# Patient Record
Sex: Male | Born: 1955
Health system: Southern US, Community
[De-identification: ages and names within clinical notes are randomized; demographics above are authoritative.]

## PROBLEM LIST (undated history)

## (undated) DIAGNOSIS — E7800 Pure hypercholesterolemia, unspecified: Secondary | ICD-10-CM

## (undated) DIAGNOSIS — F1910 Other psychoactive substance abuse, uncomplicated: Secondary | ICD-10-CM

## (undated) DIAGNOSIS — Z5189 Encounter for other specified aftercare: Secondary | ICD-10-CM

## (undated) DIAGNOSIS — I10 Essential (primary) hypertension: Secondary | ICD-10-CM

## (undated) HISTORY — DX: Encounter for other specified aftercare: Z51.89

## (undated) HISTORY — DX: Other psychoactive substance abuse, uncomplicated: F19.10

---
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
## 1975-12-03 HISTORY — PX: SHOULDER SURGERY: SHX246

## 1988-12-02 DIAGNOSIS — Z5189 Encounter for other specified aftercare: Secondary | ICD-10-CM

## 1988-12-02 HISTORY — DX: Encounter for other specified aftercare: Z51.89

## 1989-12-02 HISTORY — PX: OTHER SURGICAL HISTORY: SHX169

## 1990-12-02 HISTORY — PX: OTHER SURGICAL HISTORY: SHX169

## 1996-12-02 HISTORY — PX: OTHER SURGICAL HISTORY: SHX169

## 1999-11-05 ENCOUNTER — Encounter: Admission: RE | Admit: 1999-11-05 | Discharge: 1999-11-05 | Payer: Self-pay | Admitting: Family Medicine

## 1999-11-05 ENCOUNTER — Encounter: Payer: Self-pay | Admitting: Family Medicine

## 2000-04-25 ENCOUNTER — Ambulatory Visit (HOSPITAL_COMMUNITY): Admission: RE | Admit: 2000-04-25 | Discharge: 2000-04-25 | Payer: Self-pay | Admitting: *Deleted

## 2006-08-24 ENCOUNTER — Emergency Department (HOSPITAL_COMMUNITY): Admission: EM | Admit: 2006-08-24 | Discharge: 2006-08-24 | Payer: Self-pay | Admitting: Emergency Medicine

## 2006-08-30 ENCOUNTER — Emergency Department (HOSPITAL_COMMUNITY): Admission: EM | Admit: 2006-08-30 | Discharge: 2006-08-30 | Payer: Self-pay | Admitting: Family Medicine

## 2012-10-06 ENCOUNTER — Ambulatory Visit (INDEPENDENT_AMBULATORY_CARE_PROVIDER_SITE_OTHER): Payer: PRIVATE HEALTH INSURANCE | Admitting: Emergency Medicine

## 2012-10-06 VITALS — BP 128/64 | HR 73 | Temp 98.5°F | Resp 16 | Ht 69.0 in | Wt 226.0 lb

## 2012-10-06 DIAGNOSIS — N529 Male erectile dysfunction, unspecified: Secondary | ICD-10-CM

## 2012-10-06 DIAGNOSIS — Z0289 Encounter for other administrative examinations: Secondary | ICD-10-CM

## 2012-10-06 DIAGNOSIS — Z23 Encounter for immunization: Secondary | ICD-10-CM

## 2012-10-06 MED ORDER — VARDENAFIL HCL 20 MG PO TABS: 20.0000 mg | ORAL_TABLET | Freq: Every day | ORAL | Status: DC | PRN

## 2012-10-06 NOTE — Progress Notes (Signed)
UA: Sp Gr: 1.010 Protein: 0 Blood: 0 Sugar: 0

## 2012-10-06 NOTE — Progress Notes (Signed)
  Subjective:    Patient ID: [name] [name], male    DOB: 03/25/1956, 56 y.o.   MRN: [id number]  HPI  Difficulty maintaining an adequate erection for completion of intercourse.  Denies other complaints.  Review of Systems  As per HPI, otherwise negative.      Objective:   Physical Exam  GEN: WDWN, NAD, Non-toxic, A & O x 3 HEENT: Atraumatic, Normocephalic. Neck supple. No masses, No LAD. Ears and Nose: No external deformity. CV: RRR, No M/G/R. No JVD. No thrill. No extra heart sounds. PULM: CTA B, no wheezes, crackles, rhonchi. No retractions. No resp. distress. No accessory muscle use. ABD: S, NT, ND, +BS. No rebound. No HSM. EXTR: No c/c/e NEURO Normal gait.  PSYCH: Normally interactive. Conversant. Not depressed or anxious appearing.  Calm demeanor.         Assessment & Plan:   Erectile dysfunction levitra Needs labs but not fasting Follow up for labs.

## 2012-10-08 NOTE — Progress Notes (Signed)
Reviewed and agree.

## 2014-02-05 ENCOUNTER — Emergency Department (INDEPENDENT_AMBULATORY_CARE_PROVIDER_SITE_OTHER)
Admission: EM | Admit: 2014-02-05 | Discharge: 2014-02-05 | Disposition: A | Discharge status: Home / Self Care | Payer: PRIVATE HEALTH INSURANCE | Source: Home / Self Care | Attending: Family Medicine | Admitting: Family Medicine

## 2014-02-05 ENCOUNTER — Encounter (HOSPITAL_COMMUNITY): Payer: Self-pay | Admitting: Emergency Medicine

## 2014-02-05 DIAGNOSIS — B353 Tinea pedis: Secondary | ICD-10-CM

## 2014-02-05 LAB — POCT I-STAT, CHEM 8
BUN: 15 mg/dL (ref 6–23)
Calcium, Ion: 1.15 mmol/L (ref 1.12–1.23)
Chloride: 108 mEq/L (ref 96–112)
Creatinine, Ser: 1.3 mg/dL (ref 0.50–1.35)
Glucose, Bld: 105 mg/dL — ABNORMAL HIGH (ref 70–99)
HCT: 49 % (ref 39.0–52.0)
HEMOGLOBIN: 16.7 g/dL (ref 13.0–17.0)
Potassium: 4 mEq/L (ref 3.7–5.3)
SODIUM: 145 meq/L (ref 137–147)
TCO2: 23 mmol/L (ref 0–100)

## 2014-02-05 MED ORDER — TERBINAFINE HCL 250 MG PO TABS: 250.0000 mg | ORAL_TABLET | Freq: Every day | ORAL | Status: DC

## 2014-02-05 NOTE — ED Provider Notes (Signed)
CSN: [id number]     Arrival date & time 02/05/14  1100 History   First MD Initiated Contact with Patient 02/05/14 1205     Chief Complaint  Patient presents with  . Foot Burn   (Consider location/radiation/quality/duration/timing/severity/associated sxs/prior Treatment) Patient is a 58 y.o. male presenting with lower extremity pain. The history is provided by the patient.  Foot Pain This is a new problem. The current episode started more than 1 week ago (sx for 2 mo). The problem has been gradually worsening (worse today after soaking in clorox). Exacerbated by: clorox soak, also feels feet worse b/o steel toe boots for work.    History reviewed. No pertinent past medical history. History reviewed. No pertinent past surgical history. History reviewed. No pertinent family history. History  Substance Use Topics  . Smoking status: Never Smoker   . Smokeless tobacco: Not on file  . Alcohol Use: No    Review of Systems  Constitutional: Negative.   Musculoskeletal: Positive for joint swelling.  Skin: Positive for rash.    Allergies  Review of patient's allergies indicates no known allergies.  Home Medications   Current Outpatient Rx  Name  Route  Sig  Dispense  Refill  . terbinafine (LAMISIL) 250 MG tablet   Oral   Take 1 tablet (250 mg total) by mouth daily.   30 tablet   0   . vardenafil (LEVITRA) 20 MG tablet   Oral   Take 1 tablet (20 mg total) by mouth daily as needed for erectile dysfunction.   10 tablet   12    BP 154/86  Pulse 92  Temp(Src) 97.8 F (36.6 C) (Oral)  Resp 21  SpO2 99% Physical Exam  Nursing note and vitals reviewed. Constitutional: He is oriented to person, place, and time. He appears well-developed and well-nourished.  Musculoskeletal: He exhibits tenderness.       Right foot: He exhibits tenderness and swelling.       Feet:  Neurological: He is alert and oriented to person, place, and time.  Skin: Skin is warm and dry. Rash noted.     ED Course  Procedures (including critical care time) Labs Review Labs Reviewed  POCT I-STAT, CHEM 8 - Abnormal; Notable for the following:    Glucose, Bld 105 (*)    All other components within normal limits   Imaging Review No results found. i-stat :wnl.  MDM   1. Tinea pedis of both feet       Billy Fischer, MD 02/05/14 1256

## 2014-02-05 NOTE — ED Notes (Signed)
Pt         Reports   He    Soaked      His   feets        In  clorox          This  Am       Has         Had  atheletes  Foot  In  Past            Has  Cracking   And  Swelling       In past

## 2014-03-18 ENCOUNTER — Ambulatory Visit (INDEPENDENT_AMBULATORY_CARE_PROVIDER_SITE_OTHER): Payer: PRIVATE HEALTH INSURANCE | Admitting: Family Medicine

## 2014-03-18 VITALS — BP 138/92 | HR 82 | Temp 98.0°F | Resp 16 | Ht 70.0 in | Wt 255.0 lb

## 2014-03-18 DIAGNOSIS — N529 Male erectile dysfunction, unspecified: Secondary | ICD-10-CM

## 2014-03-18 DIAGNOSIS — E669 Obesity, unspecified: Secondary | ICD-10-CM

## 2014-03-18 DIAGNOSIS — Z0000 Encounter for general adult medical examination without abnormal findings: Secondary | ICD-10-CM

## 2014-03-18 DIAGNOSIS — Z13220 Encounter for screening for lipoid disorders: Secondary | ICD-10-CM

## 2014-03-18 DIAGNOSIS — F1010 Alcohol abuse, uncomplicated: Secondary | ICD-10-CM

## 2014-03-18 DIAGNOSIS — Z125 Encounter for screening for malignant neoplasm of prostate: Secondary | ICD-10-CM

## 2014-03-18 DIAGNOSIS — Z23 Encounter for immunization: Secondary | ICD-10-CM

## 2014-03-18 LAB — COMPREHENSIVE METABOLIC PANEL
ALBUMIN: 4.3 g/dL (ref 3.5–5.2)
ALT: 38 U/L (ref 0–53)
AST: 37 U/L (ref 0–37)
Alkaline Phosphatase: 60 U/L (ref 39–117)
BUN: 19 mg/dL (ref 6–23)
CO2: 22 meq/L (ref 19–32)
Calcium: 9.1 mg/dL (ref 8.4–10.5)
Chloride: 105 mEq/L (ref 96–112)
Creat: 1.08 mg/dL (ref 0.50–1.35)
GLUCOSE: 98 mg/dL (ref 70–99)
POTASSIUM: 4.1 meq/L (ref 3.5–5.3)
SODIUM: 138 meq/L (ref 135–145)
Total Bilirubin: 0.5 mg/dL (ref 0.2–1.2)
Total Protein: 6.6 g/dL (ref 6.0–8.3)

## 2014-03-18 LAB — CBC WITH DIFFERENTIAL/PLATELET
Basophils Absolute: 0 10*3/uL (ref 0.0–0.1)
Basophils Relative: 0 % (ref 0–1)
EOS ABS: 0.1 10*3/uL (ref 0.0–0.7)
Eosinophils Relative: 2 % (ref 0–5)
HCT: 42.8 % (ref 39.0–52.0)
HEMOGLOBIN: 14.5 g/dL (ref 13.0–17.0)
LYMPHS ABS: 1.3 10*3/uL (ref 0.7–4.0)
Lymphocytes Relative: 24 % (ref 12–46)
MCH: 30.5 pg (ref 26.0–34.0)
MCHC: 33.9 g/dL (ref 30.0–36.0)
MCV: 90.1 fL (ref 78.0–100.0)
MONO ABS: 0.6 10*3/uL (ref 0.1–1.0)
MONOS PCT: 10 % (ref 3–12)
NEUTROS ABS: 3.6 10*3/uL (ref 1.7–7.7)
NEUTROS PCT: 64 % (ref 43–77)
Platelets: 228 10*3/uL (ref 150–400)
RBC: 4.75 MIL/uL (ref 4.22–5.81)
RDW: 14.2 % (ref 11.5–15.5)
WBC: 5.6 10*3/uL (ref 4.0–10.5)

## 2014-03-18 LAB — LIPID PANEL
CHOLESTEROL: 213 mg/dL — AB (ref 0–200)
HDL: 55 mg/dL (ref >39)
LDL Cholesterol: 139 mg/dL — ABNORMAL HIGH (ref 0–99)
Total CHOL/HDL Ratio: 3.9 Ratio
Triglycerides: 93 mg/dL (ref <150)
VLDL: 19 mg/dL (ref 0–40)

## 2014-03-18 LAB — HEPATITIS C ANTIBODY: HCV Ab: NEGATIVE

## 2014-03-18 MED ORDER — SILDENAFIL CITRATE 100 MG PO TABS: ORAL_TABLET | ORAL | Status: DC
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
## 2014-03-18 NOTE — Patient Instructions (Signed)
I will be in touch with your labs.   Try to limit your alcohol use to maximum 2 glasses of sangria a day. We will get you referred for your colonoscopy.   It would be a good idea to work on weight loss; cutting down on alcohol will also help here.  Try to eat a little bit less at each meal, and avoid drinking regular sodas and sweet tea.

## 2014-03-19 ENCOUNTER — Encounter: Payer: Self-pay | Admitting: Family Medicine

## 2014-03-19 LAB — PSA: PSA: 0.9 ng/mL (ref <=4.00)

## 2014-03-21 ENCOUNTER — Encounter: Payer: Self-pay | Admitting: Internal Medicine

## 2014-04-26 ENCOUNTER — Ambulatory Visit (AMBULATORY_SURGERY_CENTER): Payer: Self-pay | Admitting: *Deleted

## 2014-04-26 VITALS — Ht 70.0 in | Wt 256.2 lb

## 2014-04-26 DIAGNOSIS — Z1211 Encounter for screening for malignant neoplasm of colon: Secondary | ICD-10-CM

## 2014-04-26 MED ORDER — MOVIPREP 100 G PO SOLR: ORAL | Status: DC

## 2014-04-26 NOTE — Progress Notes (Signed)
No allergies to eggs or soy. No problems with anesthesia.  Pt not given Emmi instructions for colonoscopy; no computer access  No oxygen use  No diet drug use  

## 2014-05-10 ENCOUNTER — Encounter: Payer: Self-pay | Admitting: Internal Medicine

## 2014-05-10 ENCOUNTER — Ambulatory Visit (AMBULATORY_SURGERY_CENTER): Payer: PRIVATE HEALTH INSURANCE | Admitting: Internal Medicine

## 2014-05-10 VITALS — BP 136/93 | HR 69 | Temp 97.5°F | Resp 16 | Ht 70.0 in | Wt 256.0 lb

## 2014-05-10 DIAGNOSIS — Z1211 Encounter for screening for malignant neoplasm of colon: Secondary | ICD-10-CM

## 2014-05-10 DIAGNOSIS — D126 Benign neoplasm of colon, unspecified: Secondary | ICD-10-CM

## 2014-05-10 MED ORDER — SODIUM CHLORIDE 0.9 % IV SOLN: 500.0000 mL | INTRAVENOUS | Status: DC

## 2014-05-10 NOTE — Patient Instructions (Signed)

## 2014-05-10 NOTE — Op Note (Signed)
[place]  [address]. [place], [address]   COLONOSCOPY PROCEDURE REPORT  PATIENT: [name] [name] [name] [name]  MR#: [id number] BIRTHDATE: July 02, 1956 , 3  yrs. old GENDER: Male ENDOSCOPIST: Jerene Bears, MD REFERRED HY:QMVHQIO Copland, M.D. PROCEDURE DATE:  05/10/2014 PROCEDURE:   Colonoscopy with cold biopsy polypectomy and Colonoscopy with snare polypectomy First Screening Colonoscopy - Avg.  risk and is 50 yrs.  old or older Yes.  Prior Negative Screening - Now for repeat screening. N/A  History of Adenoma - Now for follow-up colonoscopy & has been > or = to 3 yrs.  N/A  Polyps Removed Today? Yes. ASA CLASS:   Class II INDICATIONS:average risk screening and first colonoscopy. MEDICATIONS: MAC sedation, administered by CRNA and propofol (Diprivan) 750mg  IV  DESCRIPTION OF PROCEDURE:   After the risks benefits and alternatives of the procedure were thoroughly explained, informed consent was obtained.  A digital rectal exam revealed no rectal mass.   The LB [id number]  endoscope was introduced through the anus and advanced to the cecum, which was identified by both the appendix and ileocecal valve. No adverse events experienced. The quality of the prep was good, using MoviPrep  The instrument was then slowly withdrawn as the colon was fully examined.   COLON FINDINGS: A sessile polyp measuring 10 mm in size was found at the appendiceal orifice.  A polypectomy was performed using snare cautery.  The resection was complete and the polyp tissue was completely retrieved.   Four sessile polyps ranging between 3-51mm in size were found at the cecum, in the ascending colon, and sigmoid colon (snare).  Polypectomy was performed with cold forceps and using cold snare.  All resections were complete and all polyp tissue was completely retrieved.  There was persistent oozing from the sigmoid cold snare polypectomy site, and 1 hemostatic clip was placed with success and  cessation of bleeding.  Mild diverticulosis was noted in the descending colon and sigmoid colon.  Retroflexed views revealed no abnormalities. The time to cecum=4 minutes 03 seconds.  Withdrawal time=32 minutes 52 seconds.  The scope was withdrawn and the procedure completed. COMPLICATIONS: There were no complications.  ENDOSCOPIC IMPRESSION: 1.   Sessile polyp measuring 10 mm in size was found at the appendiceal orifice; polypectomy was performed using snare cautery 2.   Four sessile polyps ranging between 3-83mm in size were found at the cecum, in the ascending colon, and sigmoid colon; Polypectomy was performed with cold forceps and using cold snare.  Hemostatic clip placed at polypectomy site in sigmoid colon 3.   Mild diverticulosis was noted in the descending colon and sigmoid colon      RECOMMENDATIONS: 1.  Hold aspirin, aspirin products, and anti-inflammatory medication for 2 weeks. 2.  Await pathology results 3.  High fiber diet 4.  If the polyps removed today are proven to be adenomatous (pre-cancerous) polyps, you will need a colonoscopy in 3 years. Otherwise you should continue to follow colorectal cancer screening guidelines for "routine risk" patients with a colonoscopy in 10 years.  You will receive a letter within 1-2 weeks with the results of your biopsy as well as final recommendations.  Please call my office if you have not received a letter after 3 weeks.   eSigned:  Jerene Bears, MD 05/10/2014 11:44 AM   cc: The Patient and Lamar Blinks, MD   PATIENT NAME:  [name] [name] MR#: [id number]

## 2014-05-10 NOTE — Progress Notes (Signed)
Report to PACU, RN, vss, BBS= Clear.  

## 2014-05-10 NOTE — Progress Notes (Signed)
Called to room to assist during endoscopic procedure.  Patient ID and intended procedure confirmed with present staff. Received instructions for my participation in the procedure from the performing physician.  

## 2014-05-11 ENCOUNTER — Telehealth: Payer: Self-pay | Admitting: *Deleted

## 2014-05-11 NOTE — Telephone Encounter (Signed)
  Follow up Call-  Call back number 05/10/2014  Post procedure Call Back phone  # [phone]  Permission to leave phone message Yes     Patient questions:  Do you have a fever, pain , or abdominal swelling? no Pain Score  0 *  Have you tolerated food without any problems? yes  Have you been able to return to your normal activities? yes  Do you have any questions about your discharge instructions: Diet   no Medications  no Follow up visit  no  Do you have questions or concerns about your Care? no  Actions: * If pain score is 4 or above: No action needed, pain <4.

## 2014-05-17 ENCOUNTER — Encounter: Payer: Self-pay | Admitting: Internal Medicine

## 2014-10-02 ENCOUNTER — Ambulatory Visit (INDEPENDENT_AMBULATORY_CARE_PROVIDER_SITE_OTHER): Payer: Self-pay | Admitting: Emergency Medicine

## 2014-10-02 VITALS — BP 134/84 | HR 83 | Temp 98.5°F | Resp 16 | Ht 69.75 in | Wt 253.0 lb

## 2014-10-02 DIAGNOSIS — Z021 Encounter for pre-employment examination: Secondary | ICD-10-CM

## 2014-10-02 MED ORDER — SILDENAFIL CITRATE 100 MG PO TABS
100.0000 mg | ORAL_TABLET | Freq: Every day | ORAL | Status: DC | PRN
Start: 2014-10-02 — End: 2018-01-30
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
## 2014-12-16 ENCOUNTER — Ambulatory Visit (INDEPENDENT_AMBULATORY_CARE_PROVIDER_SITE_OTHER): Payer: PRIVATE HEALTH INSURANCE | Admitting: Family Medicine

## 2014-12-16 ENCOUNTER — Ambulatory Visit (INDEPENDENT_AMBULATORY_CARE_PROVIDER_SITE_OTHER): Payer: PRIVATE HEALTH INSURANCE

## 2014-12-16 VITALS — BP 136/82 | HR 93 | Temp 98.5°F | Resp 18 | Ht 70.0 in | Wt 260.0 lb

## 2014-12-16 DIAGNOSIS — J209 Acute bronchitis, unspecified: Secondary | ICD-10-CM | POA: Diagnosis not present

## 2014-12-16 DIAGNOSIS — M25572 Pain in left ankle and joints of left foot: Secondary | ICD-10-CM

## 2014-12-16 DIAGNOSIS — R05 Cough: Secondary | ICD-10-CM | POA: Diagnosis not present

## 2014-12-16 DIAGNOSIS — R059 Cough, unspecified: Secondary | ICD-10-CM

## 2014-12-16 DIAGNOSIS — L309 Dermatitis, unspecified: Secondary | ICD-10-CM

## 2014-12-16 LAB — POCT CBC
GRANULOCYTE PERCENT: 73.3 % (ref 37–80)
HCT, POC: 43.8 % (ref 43.5–53.7)
Hemoglobin: 14 g/dL — AB (ref 14.1–18.1)
Lymph, poc: 1.8 (ref 0.6–3.4)
MCH, POC: 30.2 pg (ref 27–31.2)
MCHC: 32.1 g/dL (ref 31.8–35.4)
MCV: 94.1 fL (ref 80–97)
MID (cbc): 0.6 (ref 0–0.9)
MPV: 9.2 fL (ref 0–99.8)
POC Granulocyte: 6.5 (ref 2–6.9)
POC LYMPH PERCENT: 20 %L (ref 10–50)
POC MID %: 6.7 % (ref 0–12)
Platelet Count, POC: 258 10*3/uL (ref 142–424)
RBC: 4.65 M/uL — AB (ref 4.69–6.13)
RDW, POC: 15.1 %
WBC: 8.8 10*3/uL (ref 4.6–10.2)

## 2014-12-16 MED ORDER — AZITHROMYCIN 250 MG PO TABS: ORAL_TABLET | ORAL | Status: DC

## 2014-12-16 MED ORDER — TRIAMCINOLONE ACETONIDE 0.1 % EX CREA: 1.0000 "application " | TOPICAL_CREAM | Freq: Two times a day (BID) | CUTANEOUS | Status: DC

## 2014-12-16 MED ORDER — BENZONATATE 100 MG PO CAPS: 100.0000 mg | ORAL_CAPSULE | Freq: Three times a day (TID) | ORAL | Status: DC | PRN

## 2014-12-16 MED ORDER — HYDROCODONE-HOMATROPINE 5-1.5 MG/5ML PO SYRP: 5.0000 mL | ORAL_SOLUTION | ORAL | Status: DC | PRN

## 2014-12-16 NOTE — Progress Notes (Signed)
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 He says there is always a lot of dust and he knows his lungs been affected by that. He just started wearing a respirator. He has had a respiratory tract infection for the past week. He keeps coughing a lot. He's been short of breath. He does not smoke. He does not know if he has any COPD, but suspects damage from the years of working in the environment where he works. His wife was with him tonight. A few days ago he tripped and fell when his dog tangled up his feet. He sprained his left ankle which is been hurting since then. He walks with a limp. His right shoulder is also been a little bit sore. He has had old left shoulder surgery. Also complains of some rash on his knuckles  Objective: Pleasant gentleman these, no acute distress. His throat is clear. Neck supple without nodes. Chest clear to auscultation but a little limited air exchange. Heart regular without murmurs. Right arm has good motion. Left ankle is tender medially on the tibia just above the malleolus. The joint line itself didn't seem that swollen or or tender. Fair range of motion of the ankle. Appears to have hand eczema both hands  Assessment: Ankle pain Right shoulder contusion Bronchitis Hand eczema  Plan: X-ray ankle Chest x-ray CBC  Results for orders placed or performed in visit on 12/16/14  POCT CBC  Result Value Ref Range   WBC 8.8 4.6 - 10.2 K/uL   Lymph, poc 1.8 0.6 - 3.4   POC LYMPH PERCENT 20.0 10 - 50 %L   MID (cbc) 0.6 0 - 0.9   POC MID % 6.7 0 - 12 %M   POC Granulocyte 6.5 2 - 6.9   Granulocyte percent 73.3 37 - 80 %G   RBC 4.65 (A) 4.69 - 6.13 M/uL   Hemoglobin 14.0 (A) 14.1 - 18.1 g/dL   HCT, POC 43.8 43.5 - 53.7 %   MCV 94.1 80 - 97 fL   MCH, POC 30.2 27 - 31.2 pg   MCHC 32.1 31.8 - 35.4 g/dL   RDW, POC 15.1 %   Platelet Count, POC 258 142 - 424 K/uL   MPV 9.2 0 - 99.8 fL   [facility] reading (PRIMARY) by  Dr. Linna Darner Chest x-ray has  mild increased patchiness, suspicious for old COPD. No definite acute infiltrate. Ankle x-ray negative  Plan: Swede-O for his ankle sprain Azithromycin 2 initially then 1 daily for 4 days which can treat her bronchitis or a community-acquired pneumonia Hycodan Tessalon Triamcinolone cream for his fingers.  Return if not improving  Uses respirator. 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

## 2014-12-16 NOTE — Patient Instructions (Signed)
Drink plenty of fluids and get enough rest  Stay off work through Wednesday  Return on Wednesday for me to recheck your ankle  Wear the ankle splint when you're going to be up and around  Take the antibiotic, azithromycin, 2 pills initially then 1 daily for 4 days  Use the cough pills one or 2 pills 3 times daily if needed for cough  Use the cough syrup 1 teaspoon every 4-6 hours if needed for worse cough. It will make you drowsy so do not take it when you are going to work.  You can take over-the-counter Claritin-D or Allegra-D if needed to try and decrease the drainage from ahead and open your congestion out  Use the triamcinolone cream on your hands twice daily on the affected areas.  Return at any time if abruptly worse

## 2014-12-21 ENCOUNTER — Ambulatory Visit (INDEPENDENT_AMBULATORY_CARE_PROVIDER_SITE_OTHER): Payer: PRIVATE HEALTH INSURANCE | Admitting: Family Medicine

## 2014-12-21 VITALS — BP 138/96 | HR 90 | Temp 98.5°F | Resp 18

## 2014-12-21 DIAGNOSIS — J4521 Mild intermittent asthma with (acute) exacerbation: Secondary | ICD-10-CM | POA: Diagnosis not present

## 2014-12-21 MED ORDER — PREDNISONE 20 MG PO TABS: ORAL_TABLET | ORAL | Status: DC

## 2014-12-21 MED ORDER — ALBUTEROL SULFATE HFA 108 (90 BASE) MCG/ACT IN AERS: 2.0000 | INHALATION_SPRAY | RESPIRATORY_TRACT | Status: DC | PRN

## 2014-12-21 MED ORDER — AMOXICILLIN 875 MG PO TABS: 875.0000 mg | ORAL_TABLET | Freq: Two times a day (BID) | ORAL | Status: DC

## 2014-12-21 NOTE — Progress Notes (Signed)
Subjective: 59 year old man who is here last week. He has continued coughing. He is having a hard time sleeping at night. He wheezes some. He continues to have some mucus production. He does not smoke.  Objective: TMs normal. Throat clear. Neck supple without significant nodes. Chest has expiratory wheezing. Heart regular without murmurs.  Assessment: Bronchitis, asthmatic bronchitis, intermittent  Plan: Off until Monday Amoxicillin, albuterol, prednisone

## 2014-12-21 NOTE — Patient Instructions (Signed)
Continue to drink plenty of fluids  Take prednisone 3 pills daily for 2 days, then 2 daily for 2 days, then 1 daily for 2 days for lungs  Use the inhaler 2 relations 4 times daily to try and break the wheezing  Take the amoxicillin one twice daily for further antibiotic treatment  Stay off work through this weekend but plan to return Monday  Return here if worse at anytime

## 2015-01-02 ENCOUNTER — Encounter: Payer: Self-pay | Admitting: Family Medicine

## 2015-01-02 ENCOUNTER — Telehealth: Payer: Self-pay | Admitting: Family Medicine

## 2015-01-02 NOTE — Telephone Encounter (Signed)
Patient dropped off STD form 12/28/2014 to be completed by Dr. Linna Darner. This form was placed in [employer] box on 01/02/2015 (which makes this the 3rd business day). Patient has called several times to inquire about the status of his. He thought it would've been completed the next day or 2 days after. Informed patient of our 5-7 business day completion policy. Please return to FMLA/Disabilities tray at checkout upon completion. Lauren or Delana Meyer will notify patient to come pick it up.   Thanks, [employer]

## 2015-01-04 NOTE — Telephone Encounter (Signed)
Patient called again to check the status of his STD form. Patient states that it has been 7 days however today marks the 5th business day for completion? How far are we with completion? Patient is calling daily, multiple times a day.

## 2015-01-04 NOTE — Telephone Encounter (Signed)
Please advise on status of form.

## 2015-01-09 ENCOUNTER — Encounter: Payer: Self-pay | Admitting: Family Medicine

## 2015-01-09 NOTE — Telephone Encounter (Signed)
Pt calling in for status on these papers, it is coming up on the 6th day, please advise

## 2015-01-09 NOTE — Telephone Encounter (Addendum)
Patient called again about his STD form. Found patient's form in Dr. Clayborn Heron box. It was placed in [employer]pper's box on 01/02/2015. I completed form to the best of my ability. I have given this form to [facility] for his review and signature. Patient will be in at 5pm today to pick up his form.   Thanks, [employer]

## 2015-01-12 NOTE — Telephone Encounter (Signed)
Noted.  It was signed when brought to me.

## 2015-01-24 DIAGNOSIS — Z0271 Encounter for disability determination: Secondary | ICD-10-CM

## 2015-12-22 ENCOUNTER — Encounter: Payer: Self-pay | Admitting: Family Medicine

## 2015-12-27 ENCOUNTER — Encounter: Payer: Self-pay | Admitting: Family Medicine

## 2016-01-23 IMAGING — CR DG CHEST 2V
2 series · 2 of 2 positions shown · non-contrast
Comparison: None.

CLINICAL DATA: Fever and cough for 1 week.  Wheezing.

EXAM:
CHEST  2 VIEW

[PA]
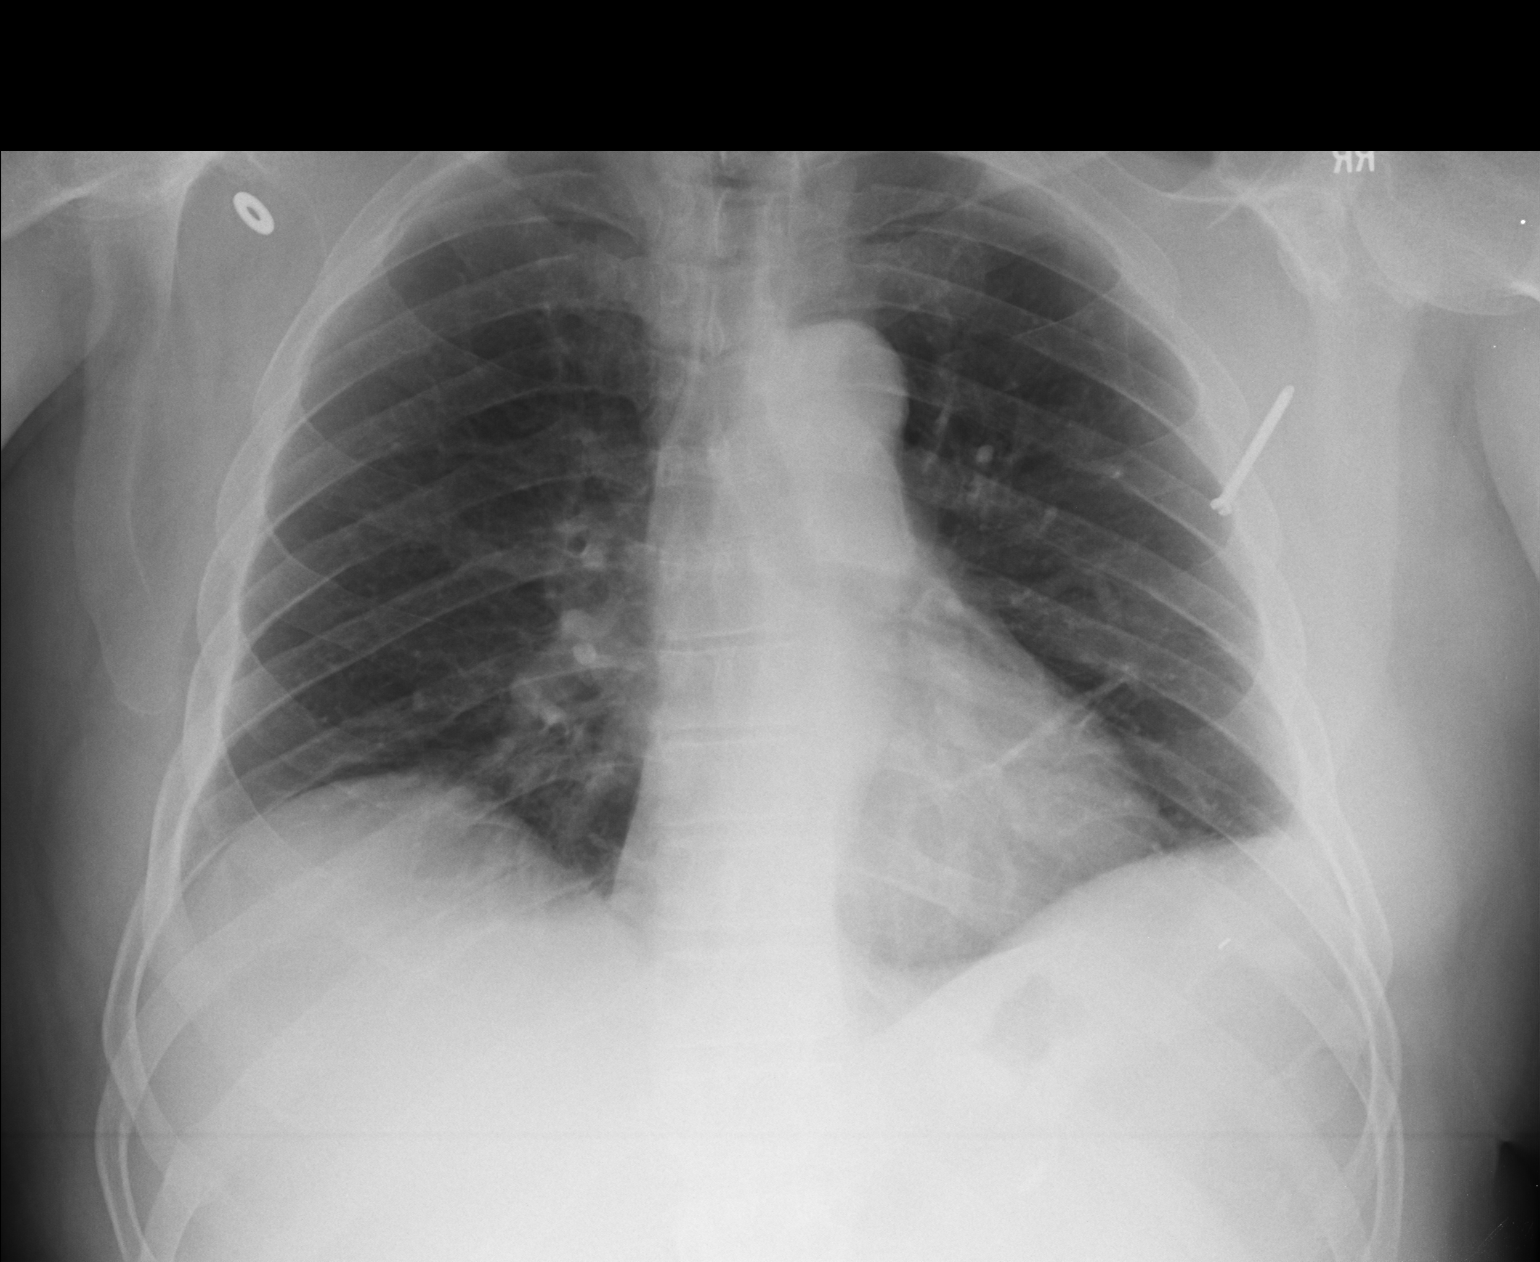

[lateral]
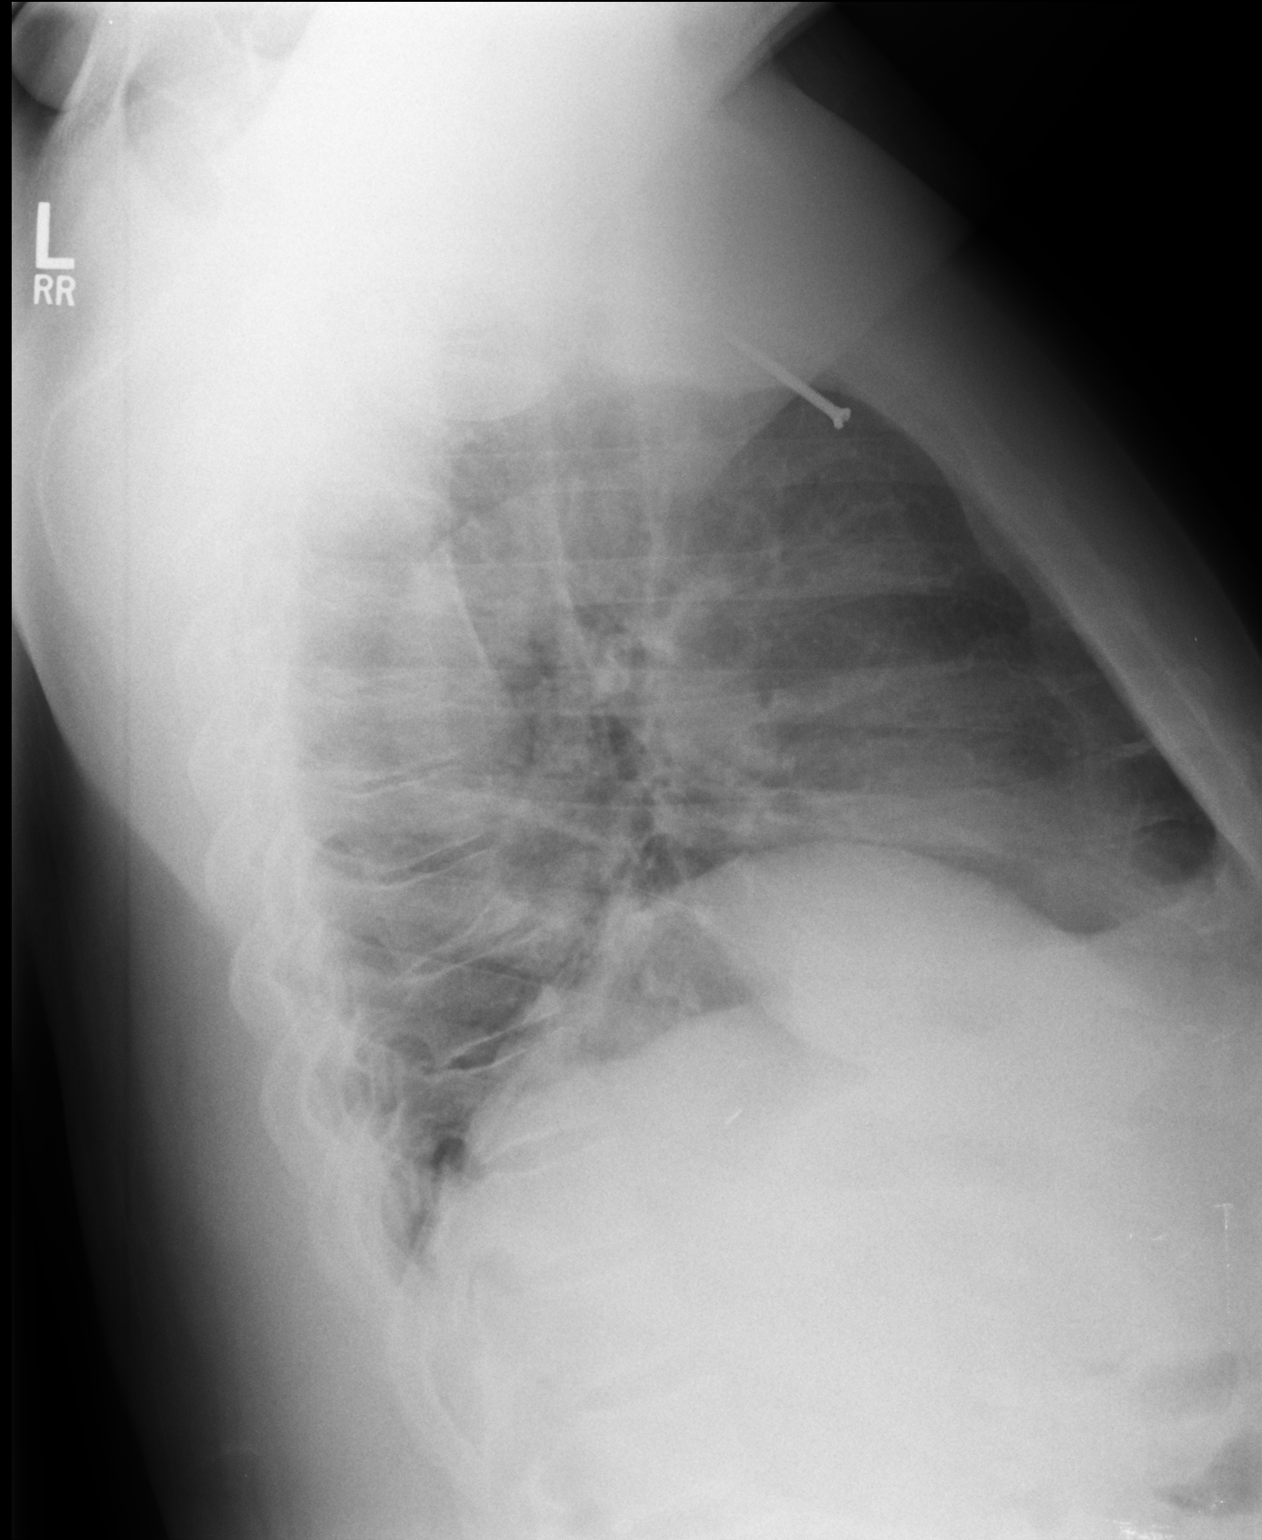

[2 of 2 positions shown; findings below may reference images not displayed]

FINDINGS: Heart size is normal. Linear opacity in the left lower lobe may be
due to scarring or atelectasis. No evidence of pulmonary edema or
consolidation. No evidence of pleural effusion. Heart size and
mediastinal contours are within normal limits.
IMPRESSION: Mild left lower lobe scarring versus atelectasis. Otherwise
negative.
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
## 2017-03-17 ENCOUNTER — Encounter: Payer: Self-pay | Admitting: *Deleted

## 2017-04-22 ENCOUNTER — Encounter: Payer: Self-pay | Admitting: Internal Medicine

## 2017-05-05 ENCOUNTER — Encounter: Payer: Self-pay | Admitting: Internal Medicine

## 2017-07-07 ENCOUNTER — Ambulatory Visit (AMBULATORY_SURGERY_CENTER): Payer: Self-pay

## 2017-07-07 VITALS — Ht 70.5 in | Wt 260.0 lb

## 2017-07-07 DIAGNOSIS — Z860100 Personal history of colon polyps, unspecified: Secondary | ICD-10-CM

## 2017-07-07 MED ORDER — SUPREP BOWEL PREP KIT 17.5-3.13-1.6 GM/177ML PO SOLN: 1.0000 | Freq: Once | ORAL | 0 refills | Status: AC

## 2017-07-07 NOTE — Progress Notes (Signed)
No allergies to eggs or soy No diet meds No home oxygen No past problems with anesthesia  Declined emmi 

## 2017-07-17 ENCOUNTER — Encounter: Payer: PRIVATE HEALTH INSURANCE | Admitting: Internal Medicine

## 2017-07-21 ENCOUNTER — Encounter: Payer: Self-pay | Admitting: Internal Medicine

## 2017-07-21 ENCOUNTER — Ambulatory Visit (AMBULATORY_SURGERY_CENTER): Payer: PRIVATE HEALTH INSURANCE | Admitting: Internal Medicine

## 2017-07-21 VITALS — BP 150/97 | HR 69 | Temp 96.8°F | Resp 17 | Ht 70.0 in | Wt 260.0 lb

## 2017-07-21 DIAGNOSIS — D122 Benign neoplasm of ascending colon: Secondary | ICD-10-CM

## 2017-07-21 DIAGNOSIS — D125 Benign neoplasm of sigmoid colon: Secondary | ICD-10-CM | POA: Diagnosis not present

## 2017-07-21 DIAGNOSIS — Z86010 Personal history of colonic polyps: Secondary | ICD-10-CM

## 2017-07-21 DIAGNOSIS — D120 Benign neoplasm of cecum: Secondary | ICD-10-CM | POA: Diagnosis not present

## 2017-07-21 DIAGNOSIS — D123 Benign neoplasm of transverse colon: Secondary | ICD-10-CM | POA: Diagnosis not present

## 2017-07-21 MED ORDER — SODIUM CHLORIDE 0.9 % IV SOLN: 500.0000 mL | INTRAVENOUS | Status: DC

## 2017-07-21 NOTE — Progress Notes (Signed)
Called to room to assist during endoscopic procedure.  Patient ID and intended procedure confirmed with present staff. Received instructions for my participation in the procedure from the performing physician.  

## 2017-07-21 NOTE — Progress Notes (Signed)
Report to PACU, RN, vss, BBS= Clear.  

## 2017-07-21 NOTE — Op Note (Signed)
[facility] Patient Name: [name] [name] Procedure Date: 07/21/2017 7:59 AM MRN: [id number] Endoscopist: Jerene Bears , MD Age: 61 Referring MD:  Date of Birth: [birth year] Gender: Male Account #: [id number] Procedure:                Colonoscopy Indications:              Surveillance: Personal history of adenomatous                            polyps on last colonoscopy 3 years ago Medicines:                Monitored Anesthesia Care Procedure:                Pre-Anesthesia Assessment:                           - Prior to the procedure, a History and Physical                            was performed, and patient medications and                            allergies were reviewed. The patient's tolerance of                            previous anesthesia was also reviewed. The risks                            and benefits of the procedure and the sedation                            options and risks were discussed with the patient.                            All questions were answered, and informed consent                            was obtained. Prior Anticoagulants: The patient has                            taken no previous anticoagulant or antiplatelet                            agents. ASA Grade Assessment: II - A patient with                            mild systemic disease. After reviewing the risks                            and benefits, the patient was deemed in                            satisfactory condition to undergo the procedure.  After obtaining informed consent, the colonoscope                            was passed under direct vision. Throughout the                            procedure, the patient's blood pressure, pulse, and                            oxygen saturations were monitored continuously. The                            Colonoscope was introduced through the anus and                            advanced to the the cecum,  identified by                            appendiceal orifice and ileocecal valve. The                            colonoscopy was performed without difficulty. The                            patient tolerated the procedure well. The quality                            of the bowel preparation was good. The ileocecal                            valve, appendiceal orifice, and rectum were                            photographed. Scope In: 8:11:47 AM Scope Out: 8:33:16 AM Scope Withdrawal Time: 0 hours 19 minutes 5 seconds  Total Procedure Duration: 0 hours 21 minutes 29 seconds  Findings:                 The digital rectal exam was normal.                           Two sessile polyps were found in the cecum. The                            polyps were 2 to 4 mm in size. These polyps were                            removed with a cold biopsy forceps. Resection and                            retrieval were complete.                           A 2 mm polyp was found in the ascending colon. The  polyp was sessile. The polyp was removed with a                            cold biopsy forceps. Resection and retrieval were                            complete.                           A 5 mm polyp was found in the transverse colon. The                            polyp was sessile. The polyp was removed with a                            cold snare. Resection and retrieval were complete.                           Two sessile polyps were found in the sigmoid colon.                            The polyps were 4 to 5 mm in size. These polyps                            were removed with a cold snare. Resection and                            retrieval were complete.                           A few medium-mouthed diverticula were found in the                            sigmoid colon.                           The retroflexed view of the distal rectum and anal                             verge was normal and showed no anal or rectal                            abnormalities. Complications:            No immediate complications. Estimated Blood Loss:     Estimated blood loss was minimal. Impression:               - Two 2 to 4 mm polyps in the cecum, removed with a                            cold biopsy forceps. Resected and retrieved.                           - One 2 mm polyp in the  ascending colon, removed                            with a cold biopsy forceps. Resected and retrieved.                           - One 5 mm polyp in the transverse colon, removed                            with a cold snare. Resected and retrieved.                           - Two 4 to 5 mm polyps in the sigmoid colon,                            removed with a cold snare. Resected and retrieved.                           - Diverticulosis in the sigmoid colon.                           - The distal rectum and anal verge are normal on                            retroflexion view. Recommendation:           - Patient has a contact number available for                            emergencies. The signs and symptoms of potential                            delayed complications were discussed with the                            patient. Return to normal activities tomorrow.                            Written discharge instructions were provided to the                            patient.                           - Resume previous diet.                           - Continue present medications.                           - Await pathology results.                           - Repeat colonoscopy is recommended for  surveillance. The colonoscopy date will be                            determined after pathology results from today's                            exam become available for review. Jerene Bears, MD 07/21/2017 8:39:20 AM This report has been signed electronically.

## 2017-07-21 NOTE — Patient Instructions (Signed)
YOU HAD AN ENDOSCOPIC PROCEDURE TODAY AT THE [facility]:   Refer to the procedure report that was given to you for any specific questions about what was found during the examination.  If the procedure report does not answer your questions, please call your gastroenterologist to clarify.  If you requested that your care partner not be given the details of your procedure findings, then the procedure report has been included in a sealed envelope for you to review at your convenience later.  YOU SHOULD EXPECT: Some feelings of bloating in the abdomen. Passage of more gas than usual.  Walking can help get rid of the air that was put into your GI tract during the procedure and reduce the bloating. If you had a lower endoscopy (such as a colonoscopy or flexible sigmoidoscopy) you may notice spotting of blood in your stool or on the toilet paper. If you underwent a bowel prep for your procedure, you may not have a normal bowel movement for a few days.  Please Note:  You might notice some irritation and congestion in your nose or some drainage.  This is from the oxygen used during your procedure.  There is no need for concern and it should clear up in a day or so.  SYMPTOMS TO REPORT IMMEDIATELY:   Following lower endoscopy (colonoscopy or flexible sigmoidoscopy):  Excessive amounts of blood in the stool  Significant tenderness or worsening of abdominal pains  Swelling of the abdomen that is new, acute  Fever of 100F or higher  For urgent or emergent issues, a gastroenterologist can be reached at any hour by calling [phone].   DIET:  We do recommend a small meal at first, but then you may proceed to your regular diet.  Drink plenty of fluids but you should avoid alcoholic beverages for 24 hours.  ACTIVITY:  You should plan to take it easy for the rest of today and you should NOT DRIVE or use heavy machinery until tomorrow (because of the sedation medicines used during the test).     FOLLOW UP: Our staff will call the number listed on your records the next business day following your procedure to check on you and address any questions or concerns that you may have regarding the information given to you following your procedure. If we do not reach you, we will leave a message.  However, if you are feeling well and you are not experiencing any problems, there is no need to return our call.  We will assume that you have returned to your regular daily activities without incident.  If any biopsies were taken you will be contacted by phone or by letter within the next 1-3 weeks.  Please call us at [phone] if you have not heard about the biopsies in 3 weeks.   Await for biopsy results to determine next repeat Colonoscopy screening Polyps (handout given) Diverticulosis (handout given)   SIGNATURES/CONFIDENTIALITY: You and/or your care partner have signed paperwork which will be entered into your electronic medical record.  These signatures attest to the fact that that the information above on your After Visit Summary has been reviewed and is understood.  Full responsibility of the confidentiality of this discharge information lies with you and/or your care-partner. 

## 2017-07-21 NOTE — Progress Notes (Signed)
Pt's states no medical or surgical changes since previsit or office visit. 

## 2017-07-22 ENCOUNTER — Telehealth: Payer: Self-pay | Admitting: *Deleted

## 2017-07-22 NOTE — Telephone Encounter (Signed)
  Follow up Call-  Call back number 07/21/2017  Post procedure Call Back phone  # [phone]  Permission to leave phone message Yes  Some recent data might be hidden     Patient questions:  Do you have a fever, pain , or abdominal swelling? No. Pain Score  0 *  Have you tolerated food without any problems? Yes.    Have you been able to return to your normal activities? Yes.    Do you have any questions about your discharge instructions: Diet   No. Medications  No. Follow up visit  No.  Do you have questions or concerns about your Care? No.  Actions: * If pain score is 4 or above: No action needed, pain <4.

## 2017-07-22 NOTE — Telephone Encounter (Signed)
  Follow up Call-  Call back number 07/21/2017  Post procedure Call Back phone  # [phone]  Permission to leave phone message Yes  Some recent data might be hidden      Lm on number provided yesterday we will attempt call later today , [name] [name]

## 2017-07-23 ENCOUNTER — Encounter: Payer: Self-pay | Admitting: Internal Medicine

## 2018-01-30 ENCOUNTER — Ambulatory Visit: Payer: BLUE CROSS/BLUE SHIELD | Admitting: Family Medicine

## 2018-01-30 ENCOUNTER — Encounter: Payer: Self-pay | Admitting: Family Medicine

## 2018-01-30 VITALS — BP 162/102 | HR 76 | Temp 98.1°F | Ht 70.5 in | Wt 255.5 lb

## 2018-01-30 DIAGNOSIS — Z0000 Encounter for general adult medical examination without abnormal findings: Secondary | ICD-10-CM | POA: Diagnosis not present

## 2018-01-30 DIAGNOSIS — I10 Essential (primary) hypertension: Secondary | ICD-10-CM

## 2018-01-30 LAB — COMPREHENSIVE METABOLIC PANEL
ALT: 32 U/L (ref 0–53)
AST: 30 U/L (ref 0–37)
Albumin: 3.9 g/dL (ref 3.5–5.2)
Alkaline Phosphatase: 57 U/L (ref 39–117)
BUN: 16 mg/dL (ref 6–23)
CALCIUM: 9.6 mg/dL (ref 8.4–10.5)
CO2: 27 mEq/L (ref 19–32)
Chloride: 105 mEq/L (ref 96–112)
Creatinine, Ser: 0.93 mg/dL (ref 0.40–1.50)
GFR: 105.87 mL/min (ref >60.00)
GLUCOSE: 101 mg/dL — AB (ref 70–99)
POTASSIUM: 3.9 meq/L (ref 3.5–5.1)
Sodium: 139 mEq/L (ref 135–145)
TOTAL PROTEIN: 7 g/dL (ref 6.0–8.3)
Total Bilirubin: 0.8 mg/dL (ref 0.2–1.2)

## 2018-01-30 LAB — PSA: PSA: 0.89 ng/mL (ref 0.10–4.00)

## 2018-01-30 LAB — LIPID PANEL
CHOL/HDL RATIO: 4
Cholesterol: 208 mg/dL — ABNORMAL HIGH (ref 0–200)
HDL: 52.9 mg/dL (ref >39.00)
LDL Cholesterol: 142 mg/dL — ABNORMAL HIGH (ref 0–99)
NONHDL: 155.12
TRIGLYCERIDES: 65 mg/dL (ref 0.0–149.0)
VLDL: 13 mg/dL (ref 0.0–40.0)

## 2018-01-30 MED ORDER — SILDENAFIL CITRATE 100 MG PO TABS: 100.0000 mg | ORAL_TABLET | Freq: Every day | ORAL | 11 refills | Status: DC | PRN

## 2018-01-30 MED ORDER — LOSARTAN POTASSIUM 50 MG PO TABS: 50.0000 mg | ORAL_TABLET | Freq: Every day | ORAL | 2 refills | Status: DC
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
## 2018-01-30 NOTE — Patient Instructions (Signed)
Around 3 times per week, check your blood pressure 4 times per day. Twice in the morning and twice in the evening. The readings should be at least one minute apart. Write down these values and bring them to your next nurse visit/appointment.  When you check your BP, make sure you have been doing something calm/relaxing 5 minutes prior to checking. Both feet should be flat on the floor and you should be sitting. Use your left arm and make sure it is in a relaxed position (on a table), and that the cuff is at the approximate level/height of your heart.  Aim to do some physical exertion for 150 minutes per week. This is typically divided into 5 days per week, 30 minutes per day. The activity should be enough to get your heart rate up. Anything is better than nothing if you have time constraints.  Healthy Eating Plan Many factors influence your heart health, including eating and exercise habits. Heart (coronary) risk increases with abnormal blood fat (lipid) levels. Heart-healthy meal planning includes limiting unhealthy fats, increasing healthy fats, and making other small dietary changes. This includes maintaining a healthy body weight to help keep lipid levels within a normal range.  WHAT IS MY PLAN?  Your health care provider recommends that you:  Drink a glass of water before meals to help with satiety.  Eat slowly.  An alternative to the water is to add Metamucil. This will help with satiety as well. It does contain calories, unlike water.  WHAT TYPES OF FAT SHOULD I CHOOSE?  Choose healthy fats more often. Choose monounsaturated and polyunsaturated fats, such as olive oil and canola oil, flaxseeds, walnuts, almonds, and seeds.  Eat more omega-3 fats. Good choices include salmon, mackerel, sardines, tuna, flaxseed oil, and ground flaxseeds. Aim to eat fish at least two times each week.  Avoid foods with partially hydrogenated oils in them. These contain trans fats. Examples of foods that  contain trans fats are stick margarine, some tub margarines, cookies, crackers, and other baked goods. If you are going to avoid a fat, this is the one to avoid!  WHAT GENERAL GUIDELINES DO I NEED TO FOLLOW?  Check food labels carefully to identify foods with trans fats. Avoid these types of options when possible.  Fill one half of your plate with vegetables and green salads. Eat 4-5 servings of vegetables per day. A serving of vegetables equals 1 cup of raw leafy vegetables,  cup of raw or cooked cut-up vegetables, or  cup of vegetable juice.  Fill one fourth of your plate with whole grains. Look for the word "whole" as the first word in the ingredient list.  Fill one fourth of your plate with lean protein foods.  Eat 4-5 servings of fruit per day. A serving of fruit equals one medium whole fruit,  cup of dried fruit,  cup of fresh, frozen, or canned fruit. Try to avoid fruits in cups/syrups as the sugar content can be high.  Eat more foods that contain soluble fiber. Examples of foods that contain this type of fiber are apples, broccoli, carrots, beans, peas, and barley. Aim to get 20-30 g of fiber per day.  Eat more home-cooked food and less restaurant, buffet, and fast food.  Limit or avoid alcohol.  Limit foods that are high in starch and sugar.  Avoid fried foods when able.  Cook foods by using methods other than frying. Baking, boiling, grilling, and broiling are all great options. Other fat-reducing suggestions include: ?   Removing the skin from poultry. ? Removing all visible fats from meats. ? Skimming the fat off of stews, soups, and gravies before serving them. ? Steaming vegetables in water or broth.  Lose weight if you are overweight. Losing just 5-10% of your initial body weight can help your overall health and prevent diseases such as diabetes and heart disease.  Increase your consumption of nuts, legumes, and seeds to 4-5 servings per week. One serving of dried  beans or legumes equals  cup after being cooked, one serving of nuts equals 1 ounces, and one serving of seeds equals  ounce or 1 tablespoon.  WHAT ARE GOOD FOODS CAN I EAT? Grains Grainy breads (try to find bread that is 3 g of fiber per slice or greater), oatmeal, light popcorn. Whole-grain cereals. Rice and pasta, including brown rice and those that are made with whole wheat. Edamame pasta is a great alternative to grain pasta. It has a higher protein content. Try to avoid significant consumption of white bread, sugary cereals, or pastries/baked goods.  Vegetables All vegetables. Cooked white potatoes do not count as vegetables.  Fruits All fruits, but limit pineapple and bananas as these fruits have a higher sugar content.  Meats and Other Protein Sources Lean, well-trimmed beef, veal, pork, and lamb. Chicken and turkey without skin. All fish and shellfish. Wild duck, rabbit, pheasant, and venison. Egg whites or low-cholesterol egg substitutes. Dried beans, peas, lentils, and tofu.Seeds and most nuts.  Dairy Low-fat or nonfat cheeses, including ricotta, string, and mozzarella. Skim or 1% milk that is liquid, powdered, or evaporated. Buttermilk that is made with low-fat milk. Nonfat or low-fat yogurt. Soy/Almond milk are good alternatives if you cannot handle dairy.  Beverages Water is the best for you. Sports drinks with less sugar are more desirable unless you are a highly active athlete.  Sweets and Desserts Sherbets and fruit ices. Honey, jam, marmalade, jelly, and syrups. Dark chocolate.  Eat all sweets and desserts in moderation.  Fats and Oils Nonhydrogenated (trans-free) margarines. Vegetable oils, including soybean, sesame, sunflower, olive, peanut, safflower, corn, canola, and cottonseed. Salad dressings or mayonnaise that are made with a vegetable oil. Limit added fats and oils that you use for cooking, baking, salads, and as spreads.  Other Cocoa powder. Coffee and  tea. Most condiments.  The items listed above may not be a complete list of recommended foods or beverages. Contact your dietitian for more options.   

## 2018-01-30 NOTE — Progress Notes (Signed)
Pre visit review using our clinic review tool, if applicable. No additional management support is needed unless otherwise documented below in the visit note. 

## 2018-02-02 ENCOUNTER — Other Ambulatory Visit: Payer: Self-pay | Admitting: Family Medicine

## 2018-02-02 MED ORDER — ROSUVASTATIN CALCIUM 20 MG PO TABS: 20.0000 mg | ORAL_TABLET | Freq: Every day | ORAL | 3 refills | Status: DC

## 2018-03-16 ENCOUNTER — Ambulatory Visit: Payer: BLUE CROSS/BLUE SHIELD | Admitting: Family Medicine

## 2018-03-16 ENCOUNTER — Encounter: Payer: Self-pay | Admitting: Family Medicine

## 2018-03-16 VITALS — BP 132/78 | HR 73 | Temp 98.0°F | Ht 71.0 in | Wt 248.0 lb

## 2018-03-16 DIAGNOSIS — I10 Essential (primary) hypertension: Secondary | ICD-10-CM

## 2018-03-16 MED ORDER — LOSARTAN POTASSIUM 100 MG PO TABS: 100.0000 mg | ORAL_TABLET | Freq: Every day | ORAL | 3 refills | Status: DC
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
## 2018-03-16 NOTE — Progress Notes (Signed)
Pre visit review using our clinic review tool, if applicable. No additional management support is needed unless otherwise documented below in the visit note. 

## 2018-03-16 NOTE — Patient Instructions (Addendum)
Take 2 tabs of the losartan until you run out.  Keep up the great work with the weight loss. You have done future [place] will thank you.  Let us know if you need anything.

## 2018-06-15 ENCOUNTER — Ambulatory Visit: Payer: BLUE CROSS/BLUE SHIELD | Admitting: Family Medicine

## 2018-07-20 ENCOUNTER — Other Ambulatory Visit: Payer: Self-pay | Admitting: Family Medicine

## 2018-07-20 DIAGNOSIS — I10 Essential (primary) hypertension: Secondary | ICD-10-CM

## 2018-11-12 ENCOUNTER — Other Ambulatory Visit: Payer: Self-pay | Admitting: Family Medicine

## 2018-11-12 DIAGNOSIS — I10 Essential (primary) hypertension: Secondary | ICD-10-CM

## 2018-11-12 MED ORDER — LOSARTAN POTASSIUM 100 MG PO TABS: 100.0000 mg | ORAL_TABLET | Freq: Every day | ORAL | 3 refills | Status: DC

## 2019-01-27 ENCOUNTER — Other Ambulatory Visit: Payer: Self-pay | Admitting: Family Medicine

## 2019-03-03 ENCOUNTER — Other Ambulatory Visit: Payer: Self-pay | Admitting: Family Medicine

## 2019-03-09 ENCOUNTER — Other Ambulatory Visit: Payer: Self-pay | Admitting: Family Medicine

## 2019-03-09 DIAGNOSIS — I10 Essential (primary) hypertension: Secondary | ICD-10-CM

## 2019-03-09 MED ORDER — LOSARTAN POTASSIUM 100 MG PO TABS: 100.0000 mg | ORAL_TABLET | Freq: Every day | ORAL | 0 refills | Status: DC

## 2019-03-09 NOTE — Telephone Encounter (Signed)
30 day courtesy refill given.

## 2019-03-12 ENCOUNTER — Telehealth: Payer: Self-pay | Admitting: Family Medicine

## 2019-03-12 NOTE — Telephone Encounter (Signed)
Copied from [facility] [id number]. Topic: Quick Communication - Rx Refill/Question >> Mar 12, 2019 12:39 PM Percell Belt A wrote: Medication: losartan (COZAAR) 100 MG tablet [[id number]] rosuvastatin (CRESTOR) 20 MG tablet [[id number]]  sildenafil (VIAGRA) 100 MG tablet [[id number]]    Has the patient contacted their pharmacy? No - pt needs new script sent to [facility] on [address]  (Agent: If no, request that the patient contact the pharmacy for the refill.) (Agent: If yes, when and what did the pharmacy advise?)  Preferred Pharmacy (with phone number or street name): [facility] on [address]   Agent: Please be advised that RX refills may take up to 3 business days. We ask that you follow-up with your pharmacy.

## 2019-03-15 ENCOUNTER — Other Ambulatory Visit: Payer: Self-pay

## 2019-03-15 ENCOUNTER — Encounter: Payer: Self-pay | Admitting: Family Medicine

## 2019-03-15 ENCOUNTER — Ambulatory Visit (INDEPENDENT_AMBULATORY_CARE_PROVIDER_SITE_OTHER): Payer: BLUE CROSS/BLUE SHIELD | Admitting: Family Medicine

## 2019-03-15 DIAGNOSIS — E785 Hyperlipidemia, unspecified: Secondary | ICD-10-CM | POA: Insufficient documentation

## 2019-03-15 DIAGNOSIS — N529 Male erectile dysfunction, unspecified: Secondary | ICD-10-CM | POA: Diagnosis not present

## 2019-03-15 DIAGNOSIS — I10 Essential (primary) hypertension: Secondary | ICD-10-CM

## 2019-03-15 MED ORDER — LOSARTAN POTASSIUM 100 MG PO TABS: 100.0000 mg | ORAL_TABLET | Freq: Every day | ORAL | 6 refills | Status: DC

## 2019-03-15 MED ORDER — LOSARTAN POTASSIUM 100 MG PO TABS: 100.0000 mg | ORAL_TABLET | Freq: Every day | ORAL | 0 refills | Status: DC

## 2019-03-15 MED ORDER — SILDENAFIL CITRATE 100 MG PO TABS: 100.0000 mg | ORAL_TABLET | Freq: Every day | ORAL | 6 refills | Status: DC | PRN

## 2019-03-15 MED ORDER — ROSUVASTATIN CALCIUM 20 MG PO TABS: 20.0000 mg | ORAL_TABLET | Freq: Every day | ORAL | 6 refills | Status: DC

## 2019-03-15 MED ORDER — SILDENAFIL CITRATE 100 MG PO TABS: 100.0000 mg | ORAL_TABLET | Freq: Every day | ORAL | 0 refills | Status: DC | PRN

## 2019-03-15 MED ORDER — ROSUVASTATIN CALCIUM 20 MG PO TABS: 20.0000 mg | ORAL_TABLET | Freq: Every day | ORAL | 0 refills | Status: DC

## 2019-03-15 NOTE — Telephone Encounter (Signed)
Please sched appt. Ty.

## 2019-03-15 NOTE — Progress Notes (Addendum)
Virtual Visit via Video Note  I connected with [name] [name] on 03/11/19 at  1:15 PM EDT by a video enabled telemedicine application and verified that I am speaking with the correct person using two identifiers. Due to technical issues, the visit was converted to a telephone encounter.    I discussed the limitations of evaluation and management by telemedicine and the availability of in person appointments. The patient expressed understanding and agreed to proceed.  History of Present Illness: Hypertension Patient presents for hypertension follow up. He does monitor home blood pressures. Blood pressures ranging on average from 130's/70-80's. He is compliant with medication- losartan 100 mg/d. Patient has these side effects of medication: none He is adhering to a healthy diet overall. Exercise: walking  Hyperlipidemia Patient presents for dyslipidemia follow up. Currently being treated with Crestor 20 mg/d and compliance with treatment thus far has been good. He complains of myalgias. He is adhering to a healthy. Exercise: walking The patient is not known to have coexisting coronary artery disease.   Hx of ED. Takes 50-100 mg Viagra prn. Has been out. Does well, no AE's.   Observations/Objective: No conversational dyspnea Age appropriate judgment and insight Nml affect and mood  Assessment and Plan: Essential hypertension - Plan: losartan (COZAAR) 100 MG tablet, Comprehensive metabolic panel  Hyperlipidemia, unspecified hyperlipidemia type - Plan: rosuvastatin (CRESTOR) 20 MG tablet, Lipid panel  Erectile dysfunction, unspecified erectile dysfunction type - Plan: sildenafil (VIAGRA) 100 MG tablet  Orders as above. Counseled on diet and exercise.  Follow Up Instructions: 6 mo for CPE, labs at earliest convenience.    I discussed the assessment and treatment plan with the patient. The patient was provided an opportunity to ask questions and all were answered. The patient  agreed with the plan and demonstrated an understanding of the instructions.   The patient was advised to call back or seek an in-person evaluation if the symptoms worsen or if the condition fails to improve as anticipated.   [place], DO

## 2019-03-15 NOTE — Telephone Encounter (Signed)
Schedule virtual visit today

## 2019-03-15 NOTE — Telephone Encounter (Signed)
CALLED AND SCHEDULED VIRTUAL VISIT TODAY.

## 2019-03-19 ENCOUNTER — Other Ambulatory Visit: Payer: BLUE CROSS/BLUE SHIELD

## 2019-03-22 ENCOUNTER — Other Ambulatory Visit: Payer: BLUE CROSS/BLUE SHIELD

## 2019-03-23 ENCOUNTER — Telehealth: Payer: Self-pay | Admitting: Family Medicine

## 2019-03-23 DIAGNOSIS — Z125 Encounter for screening for malignant neoplasm of prostate: Secondary | ICD-10-CM

## 2019-03-23 NOTE — Telephone Encounter (Signed)
The patient missed his lab appt and is going to reschedule for in the morning.  He wanted to make sure labs ordered were all needed for a CPE lab appt. Also he would like his BP checked///ok to do at pharmacy? Or nurse visit?

## 2019-03-23 NOTE — Telephone Encounter (Signed)
Labs have been ordered. He can return for nurse visit or if he wants to do a virtual visit with me to minimize time in clinic, that is fine too. Ty.

## 2019-03-24 NOTE — Telephone Encounter (Signed)
Patient informed. 

## 2019-03-24 NOTE — Telephone Encounter (Signed)
Called left message to call back 

## 2019-03-25 ENCOUNTER — Other Ambulatory Visit (INDEPENDENT_AMBULATORY_CARE_PROVIDER_SITE_OTHER): Payer: BLUE CROSS/BLUE SHIELD

## 2019-03-25 ENCOUNTER — Other Ambulatory Visit: Payer: Self-pay

## 2019-03-25 DIAGNOSIS — I10 Essential (primary) hypertension: Secondary | ICD-10-CM | POA: Diagnosis not present

## 2019-03-25 DIAGNOSIS — E785 Hyperlipidemia, unspecified: Secondary | ICD-10-CM

## 2019-03-25 DIAGNOSIS — Z125 Encounter for screening for malignant neoplasm of prostate: Secondary | ICD-10-CM | POA: Diagnosis not present

## 2019-03-25 LAB — COMPREHENSIVE METABOLIC PANEL
ALT: 38 U/L (ref 0–53)
AST: 30 U/L (ref 0–37)
Albumin: 4.4 g/dL (ref 3.5–5.2)
Alkaline Phosphatase: 61 U/L (ref 39–117)
BUN: 15 mg/dL (ref 6–23)
CO2: 28 mEq/L (ref 19–32)
Calcium: 9.4 mg/dL (ref 8.4–10.5)
Chloride: 102 mEq/L (ref 96–112)
Creatinine, Ser: 1.09 mg/dL (ref 0.40–1.50)
GFR: 82.63 mL/min (ref >60.00)
Glucose, Bld: 100 mg/dL — ABNORMAL HIGH (ref 70–99)
Potassium: 4.2 mEq/L (ref 3.5–5.1)
Sodium: 139 mEq/L (ref 135–145)
Total Bilirubin: 0.9 mg/dL (ref 0.2–1.2)
Total Protein: 7 g/dL (ref 6.0–8.3)

## 2019-03-25 LAB — LIPID PANEL
Cholesterol: 155 mg/dL (ref 0–200)
HDL: 76.6 mg/dL (ref >39.00)
LDL Cholesterol: 63 mg/dL (ref 0–99)
NonHDL: 78.41
Total CHOL/HDL Ratio: 2
Triglycerides: 76 mg/dL (ref 0.0–149.0)
VLDL: 15.2 mg/dL (ref 0.0–40.0)

## 2019-03-25 LAB — PSA: PSA: 1.34 ng/mL (ref 0.10–4.00)

## 2019-03-26 ENCOUNTER — Other Ambulatory Visit: Payer: Self-pay | Admitting: Family Medicine

## 2019-03-26 DIAGNOSIS — R972 Elevated prostate specific antigen [PSA]: Secondary | ICD-10-CM

## 2019-03-30 ENCOUNTER — Encounter: Payer: Self-pay | Admitting: Family Medicine

## 2019-03-30 ENCOUNTER — Other Ambulatory Visit: Payer: Self-pay

## 2019-03-30 ENCOUNTER — Ambulatory Visit (INDEPENDENT_AMBULATORY_CARE_PROVIDER_SITE_OTHER): Payer: BLUE CROSS/BLUE SHIELD | Admitting: Family Medicine

## 2019-03-30 DIAGNOSIS — I10 Essential (primary) hypertension: Secondary | ICD-10-CM | POA: Diagnosis not present

## 2019-03-30 DIAGNOSIS — Z7189 Other specified counseling: Secondary | ICD-10-CM
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
## 2019-04-06 ENCOUNTER — Telehealth: Payer: Self-pay | Admitting: Family Medicine

## 2019-04-06 NOTE — Telephone Encounter (Signed)
I could add, "It would be beneficial to his health if he were allowed to work from home or stay home during the governor's recommendation to quarantine". If that is OK with him, OK to write letter.

## 2019-04-06 NOTE — Telephone Encounter (Signed)
Copied from [place] [id number]. Topic: General - Other >> Apr 06, 2019 12:41 PM Rayann Heman wrote: Reason for CRM:  pt called and stated that he will need a more detailed note that states that the patient need to be excused until the governor lifts stay at home order. Please advise

## 2019-04-07 ENCOUNTER — Encounter: Payer: Self-pay | Admitting: Family Medicine

## 2019-04-07 NOTE — Telephone Encounter (Signed)
Letter completed///patient notified to pickup at the front desk.

## 2019-04-07 NOTE — Telephone Encounter (Signed)
Patient said that is fine to write the letter that way. Please Advise. He would like to pick it up today or tomorrow. [phone]

## 2019-04-28 ENCOUNTER — Other Ambulatory Visit: Payer: Self-pay

## 2019-04-28 ENCOUNTER — Ambulatory Visit (INDEPENDENT_AMBULATORY_CARE_PROVIDER_SITE_OTHER): Payer: BLUE CROSS/BLUE SHIELD | Admitting: Family Medicine

## 2019-04-28 ENCOUNTER — Encounter: Payer: Self-pay | Admitting: Family Medicine

## 2019-04-28 DIAGNOSIS — K625 Hemorrhage of anus and rectum: Secondary | ICD-10-CM

## 2019-04-28 MED ORDER — HYDROCORTISONE (PERIANAL) 2.5 % EX CREA: 1.0000 "application " | TOPICAL_CREAM | Freq: Two times a day (BID) | CUTANEOUS | 0 refills | Status: AC
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
## 2019-05-10 ENCOUNTER — Other Ambulatory Visit: Payer: Self-pay

## 2019-05-10 ENCOUNTER — Telehealth: Payer: Self-pay | Admitting: *Deleted

## 2019-05-10 ENCOUNTER — Other Ambulatory Visit (INDEPENDENT_AMBULATORY_CARE_PROVIDER_SITE_OTHER): Payer: BLUE CROSS/BLUE SHIELD

## 2019-05-10 DIAGNOSIS — R972 Elevated prostate specific antigen [PSA]: Secondary | ICD-10-CM | POA: Diagnosis not present

## 2019-05-10 LAB — PSA: PSA: 0.98 ng/mL (ref 0.10–4.00)

## 2019-05-10 NOTE — Telephone Encounter (Signed)
Noted  

## 2019-05-10 NOTE — Telephone Encounter (Signed)
Pt had lab appt this morning and wanted me to let his PCP know that he has not had any rectal bleeding x 2 days now. Medication seems to be helping. Advised pt to let us know if symptoms return or worsen and he voices understanding.

## 2019-05-19 ENCOUNTER — Telehealth: Payer: Self-pay

## 2019-05-19 NOTE — Telephone Encounter (Signed)
Copied from [place] [phone] [phone]. Topic: General - Other >> May 18, 2019  4:11 PM Marin Olp L wrote: Reason for CRM: Patient needs a note for work to excuse him due to being HIGH RISK and currently being on quarantine. It needs to say how long he will be out of work if possible. Patient would like a call back and was told he needs it by Friday.

## 2019-05-19 NOTE — Telephone Encounter (Signed)
Make sure a charge form is attached. Ty.

## 2019-05-20 ENCOUNTER — Telehealth: Payer: Self-pay | Admitting: Family Medicine

## 2019-05-20 NOTE — Telephone Encounter (Signed)
Called left message to call back 

## 2019-05-20 NOTE — Telephone Encounter (Signed)
Ask him how long he is planning to be out of work and if just a letter will suffice. FMLA? Ty.

## 2019-05-20 NOTE — Telephone Encounter (Signed)
We have already done 3 letters??? I need clarification how long you/he wishes not to work

## 2019-05-20 NOTE — Telephone Encounter (Signed)
I called the patient and he had no dates in mind/was also not aware of what FMLA was.  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  He states the other guys he works with are not wearing mask. He may need an appt to discuss situation, still not clear what he is specifically wanting.

## 2019-05-20 NOTE — Telephone Encounter (Signed)
Pt called about letter for work. Shirlean Mylar stated she would call him back on tomorrow once Dr. Nani Ravens is back in the office.

## 2019-05-21 ENCOUNTER — Ambulatory Visit (INDEPENDENT_AMBULATORY_CARE_PROVIDER_SITE_OTHER): Payer: BC Managed Care – PPO | Admitting: Family Medicine

## 2019-05-21 ENCOUNTER — Encounter: Payer: Self-pay | Admitting: Family Medicine

## 2019-05-21 ENCOUNTER — Other Ambulatory Visit: Payer: Self-pay

## 2019-05-21 DIAGNOSIS — Z7189 Other specified counseling: Secondary | ICD-10-CM

## 2019-05-21 DIAGNOSIS — Z0289 Encounter for other administrative examinations: Secondary | ICD-10-CM | POA: Diagnosis not present

## 2019-05-21 NOTE — Telephone Encounter (Signed)
Schedule E visit to discuss please. Ty.
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
## 2019-05-21 NOTE — Telephone Encounter (Signed)
Scheduled telephone visit for today at 10:15

## 2019-07-05 ENCOUNTER — Encounter: Payer: Self-pay | Admitting: Family Medicine

## 2019-07-05 DIAGNOSIS — Z0279 Encounter for issue of other medical certificate: Secondary | ICD-10-CM

## 2019-07-05 NOTE — Telephone Encounter (Signed)
Pt need update RTW note  After phase 3 has ended. Please call pt when letter has been written and Fax to [place] [phone]

## 2019-07-05 NOTE — Telephone Encounter (Signed)
Letter completed and faxed to number below.// Received confirmation. Charge sheet attached///copy given to [employer].

## 2019-07-05 NOTE — Telephone Encounter (Signed)
He needs it to say he can go back after phase 3?

## 2019-07-07 ENCOUNTER — Telehealth: Payer: Self-pay | Admitting: Family Medicine

## 2019-07-07 NOTE — Telephone Encounter (Signed)
That letter was completed a couple days ago and faxed as request stated to HR. Printed letter called the patient to pickup copy at front desk --- Left detailed message letter at front desk ready.

## 2019-07-07 NOTE — Telephone Encounter (Signed)
Faxed to [place].

## 2019-09-13 ENCOUNTER — Other Ambulatory Visit: Payer: Self-pay

## 2019-09-14 ENCOUNTER — Ambulatory Visit (INDEPENDENT_AMBULATORY_CARE_PROVIDER_SITE_OTHER): Payer: BLUE CROSS/BLUE SHIELD | Admitting: Family Medicine

## 2019-09-14 ENCOUNTER — Encounter: Payer: Self-pay | Admitting: Family Medicine

## 2019-09-14 ENCOUNTER — Other Ambulatory Visit: Payer: Self-pay

## 2019-09-14 VITALS — BP 140/86 | HR 107 | Temp 95.9°F | Ht 70.5 in | Wt 261.0 lb

## 2019-09-14 DIAGNOSIS — Z0000 Encounter for general adult medical examination without abnormal findings: Secondary | ICD-10-CM | POA: Diagnosis not present

## 2019-09-14 DIAGNOSIS — R972 Elevated prostate specific antigen [PSA]: Secondary | ICD-10-CM

## 2019-09-14 DIAGNOSIS — M25511 Pain in right shoulder: Secondary | ICD-10-CM

## 2019-09-14 DIAGNOSIS — G8929 Other chronic pain: Secondary | ICD-10-CM

## 2019-09-14 DIAGNOSIS — R490 Dysphonia: Secondary | ICD-10-CM | POA: Diagnosis not present

## 2019-09-14 DIAGNOSIS — H259 Unspecified age-related cataract: Secondary | ICD-10-CM | POA: Insufficient documentation

## 2019-09-14 LAB — COMPREHENSIVE METABOLIC PANEL
ALT: 41 U/L (ref 0–53)
AST: 28 U/L (ref 0–37)
Albumin: 4.5 g/dL (ref 3.5–5.2)
Alkaline Phosphatase: 68 U/L (ref 39–117)
BUN: 15 mg/dL (ref 6–23)
CO2: 27 mEq/L (ref 19–32)
Calcium: 10 mg/dL (ref 8.4–10.5)
Chloride: 101 mEq/L (ref 96–112)
Creatinine, Ser: 1.03 mg/dL (ref 0.40–1.50)
GFR: 88.07 mL/min (ref >60.00)
Glucose, Bld: 103 mg/dL — ABNORMAL HIGH (ref 70–99)
Potassium: 4.2 mEq/L (ref 3.5–5.1)
Sodium: 139 mEq/L (ref 135–145)
Total Bilirubin: 1 mg/dL (ref 0.2–1.2)
Total Protein: 7 g/dL (ref 6.0–8.3)

## 2019-09-14 LAB — CBC
HCT: 44 % (ref 39.0–52.0)
Hemoglobin: 14.5 g/dL (ref 13.0–17.0)
MCHC: 32.9 g/dL (ref 30.0–36.0)
MCV: 93.7 fl (ref 78.0–100.0)
Platelets: 236 10*3/uL (ref 150.0–400.0)
RBC: 4.7 Mil/uL (ref 4.22–5.81)
RDW: 13.3 % (ref 11.5–15.5)
WBC: 9.5 10*3/uL (ref 4.0–10.5)

## 2019-09-14 LAB — LIPID PANEL
Cholesterol: 133 mg/dL (ref 0–200)
HDL: 52.3 mg/dL (ref >39.00)
LDL Cholesterol: 61 mg/dL (ref 0–99)
NonHDL: 80.77
Total CHOL/HDL Ratio: 3
Triglycerides: 98 mg/dL (ref 0.0–149.0)
VLDL: 19.6 mg/dL (ref 0.0–40.0)

## 2019-09-14 LAB — PSA: PSA: 1.19 ng/mL (ref 0.10–4.00)

## 2019-09-14 NOTE — Patient Instructions (Addendum)
Give [place] 2-3 business days to get the results of your labs back.   Keep the diet clean and stay active.  Call your insurance company to see what erectile dysfunction medications they wil cover. Looking for manufacturer coupons online is also reasonable.   If you do not hear anything about your referrals in the next 1-2 weeks, call our office and ask for an update.  If you change your mind about the injection for your shoulder or physical therapy, let me know. I think you would benefit from both.   Let us know if you need anything.
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
## 2019-09-15 ENCOUNTER — Telehealth: Payer: Self-pay | Admitting: Family Medicine

## 2019-09-15 NOTE — Telephone Encounter (Signed)
Copied from [place] [phone]. Topic: General - Other >> Sep 15, 2019  9:31 AM [name] wrote: Patient would like a callback from Dr. Nani Ravens nurse in regards to referral that was placed.   He has received a call from one of the 3 referrals placed yesterday and was unsure what they wanted. He will call them back to clarify

## 2019-09-21 DIAGNOSIS — K219 Gastro-esophageal reflux disease without esophagitis: Secondary | ICD-10-CM | POA: Insufficient documentation

## 2019-09-21 DIAGNOSIS — R490 Dysphonia: Secondary | ICD-10-CM | POA: Insufficient documentation

## 2019-10-11 ENCOUNTER — Telehealth: Payer: Self-pay | Admitting: Family Medicine

## 2019-10-11 NOTE — Telephone Encounter (Signed)
Referral was placed but the OT therapy had not called the patient yet for an appt. Gave the patient their number [phone] to call and schedule himself. The patient took down the number and stated will take care of.

## 2019-10-11 NOTE — Telephone Encounter (Signed)
Copied from [place] [phone]. Topic: General - Other >> Oct 11, 2019  4:33 PM Yvette Rack wrote: Reason for CRM: Pt stated he had a conversation with Dr. Nani Ravens about disability claim and he was told someone would call him but he has not heard back from anyone. Pt stated this is his second time calling about it and he would like a call back. Cb# [phone]  I have not seen any paper work

## 2019-10-11 NOTE — Telephone Encounter (Signed)
OT therapy for disability evaluation was placed on 10/13. Any update? Or a number he could call to facilitate appt at this pt? Ty.

## 2019-10-11 NOTE — Telephone Encounter (Signed)
Pt called back and stated that the office that he has been referred to for OT is not in his network.  He has to have someone with in [facility] [facility].  Please advise  Best number [phone]

## 2019-10-12 ENCOUNTER — Telehealth: Payer: Self-pay | Admitting: Family Medicine

## 2019-10-12 NOTE — Telephone Encounter (Signed)
Called left detailed message ok per Patient that referral went to [facility] in [place] and phone number is [phone]. He said ok to leave a message and would take care of calling himself to try to schedule an appt.

## 2019-10-12 NOTE — Telephone Encounter (Signed)
Referral changed to [facility]

## 2019-10-12 NOTE — Telephone Encounter (Signed)
Copied from [place] [phone]. Topic: General - Other >> Oct 12, 2019 12:31 PM [name] wrote: Santiago Glad would like a callback at [phone]. Patient showed up to their office stating that he was referred for physical therapy but the office was not given any information on the patient  See telephone note regarding this message

## 2019-10-12 NOTE — Telephone Encounter (Signed)
Called and informed the patient that [facility] would call him with an appt once they get our referral.

## 2019-10-12 NOTE — Telephone Encounter (Signed)
Pt is requesting a call back from Referrals, was unable to schedule with [facility] because their scheduler needs his "Order number" I provided him with referral number, please advise.

## 2019-10-13 ENCOUNTER — Telehealth: Payer: Self-pay | Admitting: Family Medicine

## 2019-10-13 ENCOUNTER — Ambulatory Visit: Payer: BLUE CROSS/BLUE SHIELD | Admitting: Occupational Therapy

## 2019-10-13 NOTE — Telephone Encounter (Signed)
Copied from [place] [phone]. Topic: General - Other >> Oct 13, 2019  2:31 PM [name] wrote: [facility]  called to inform Dr..Wendling that based on order that was sent over the therapist is not able to do a disability evaluation. Please advise

## 2019-10-13 NOTE — Telephone Encounter (Signed)
Then we need to send him somewhere where they do disability evaluations. Any options, Gwen?

## 2019-10-13 NOTE — Telephone Encounter (Signed)
They don't do those evaluations? I know [facility] has done them in the past, is that no longer an option?

## 2019-10-13 NOTE — Telephone Encounter (Signed)
My understanding is he could not go to [facility]

## 2019-10-13 NOTE — Telephone Encounter (Signed)
Gwen , Could you help with this patient.

## 2019-10-14 ENCOUNTER — Other Ambulatory Visit: Payer: Self-pay | Admitting: Family Medicine

## 2019-10-14 DIAGNOSIS — E785 Hyperlipidemia, unspecified: Secondary | ICD-10-CM

## 2019-10-14 DIAGNOSIS — I10 Essential (primary) hypertension: Secondary | ICD-10-CM

## 2019-10-14 NOTE — Telephone Encounter (Signed)
Patient requesting call back from [facility] to discuss further.

## 2019-10-15 ENCOUNTER — Telehealth: Payer: Self-pay

## 2019-10-15 NOTE — Telephone Encounter (Addendum)
Caller name: Joy  Relation to pt: [facility]  Call back number: [phone] fax# [phone]   Reason for call:  Received discharge form but specialist is requesting a signed order reflecting OT request , dx and MD signature.

## 2019-10-15 NOTE — Telephone Encounter (Signed)
Copied from [place] [phone]. Topic: General - Call Back - No Documentation >> Oct 15, 2019  9:37 AM [name] wrote: Reason for CRM:  Pt states he got a call from the office, but no message was left.  Calling to see who was calling.

## 2019-10-15 NOTE — Telephone Encounter (Signed)
Did not call this patient today/called left message to call back.

## 2019-10-18 NOTE — Telephone Encounter (Signed)
Not sure what this message is regarding. Gwen has sent several messages regarding this patient and OT request

## 2019-10-20 ENCOUNTER — Other Ambulatory Visit: Payer: Self-pay | Admitting: Family Medicine

## 2019-10-20 DIAGNOSIS — G8929 Other chronic pain: Secondary | ICD-10-CM

## 2019-10-21 ENCOUNTER — Other Ambulatory Visit: Payer: Self-pay | Admitting: Family Medicine

## 2019-10-21 ENCOUNTER — Telehealth: Payer: Self-pay

## 2019-10-21 DIAGNOSIS — M25511 Pain in right shoulder: Secondary | ICD-10-CM

## 2019-10-21 DIAGNOSIS — G8929 Other chronic pain: Secondary | ICD-10-CM | POA: Insufficient documentation

## 2019-10-21 NOTE — Telephone Encounter (Signed)
Copied from [place] [phone]. Topic: General - Other >> Oct 21, 2019  1:23 PM Rutherford Nail, [place] wrote: Reason for CRM: Brittney with [facility] calling and would like to speak with the referral coordinator. Spoke with [facility] and was advised that she was currently in a meeting and would have to call back.  CB#: [phone]

## 2019-10-25 ENCOUNTER — Telehealth: Payer: Self-pay | Admitting: Family Medicine

## 2019-10-25 NOTE — Telephone Encounter (Signed)
Could you please check on this ??

## 2019-10-25 NOTE — Telephone Encounter (Signed)
Copied from [place] [phone]. Topic: General - Other >> Oct 25, 2019  1:20 PM Carolyn Stare wrote: Pt req a call back from Dr Nani Ravens nurse [phone], he has questions about being referral for a Evalu for Disability, pt insurance is with [facility]

## 2019-10-25 NOTE — Telephone Encounter (Signed)
Called to let patient know referral went to [facility]. Patient notified and patient has already spoken to [facility] has his appt scheduled for December

## 2019-10-26 NOTE — Telephone Encounter (Signed)
Joy with [facility] PT calling.  States that they are not sure what it is they have received since paperwork starts off saying discharge paperwork.

## 2019-10-26 NOTE — Telephone Encounter (Signed)
Called [facility] and all is taken care of.  Disregard message

## 2019-11-11 ENCOUNTER — Telehealth: Payer: Self-pay | Admitting: Family Medicine

## 2019-11-11 NOTE — Telephone Encounter (Signed)
It is my understanding they will be performing that exam. Ty.

## 2019-11-11 NOTE — Telephone Encounter (Signed)
Reason for CRM: Pt called stating he is not wanting to do physical therapy and instead is requesting to have the function capacity exam. Please advise.

## 2019-11-12 NOTE — Telephone Encounter (Signed)
Attempted to reach pt. Call was picked up but no one responded.

## 2019-11-15 NOTE — Telephone Encounter (Signed)
Called the patient and no answer.

## 2019-11-17 NOTE — Telephone Encounter (Signed)
Called left message to call back regarding message Unable to get on the phone

## 2020-01-14 ENCOUNTER — Other Ambulatory Visit: Payer: Self-pay | Admitting: Family Medicine

## 2020-01-14 DIAGNOSIS — N529 Male erectile dysfunction, unspecified: Secondary | ICD-10-CM

## 2020-03-14 ENCOUNTER — Ambulatory Visit: Payer: BLUE CROSS/BLUE SHIELD | Admitting: Family Medicine

## 2020-07-28 ENCOUNTER — Ambulatory Visit
Admission: EM | Admit: 2020-07-28 | Discharge: 2020-07-28 | Disposition: A | Discharge status: Home / Self Care | Payer: 59 | Attending: Emergency Medicine | Admitting: Emergency Medicine

## 2020-07-28 DIAGNOSIS — Z113 Encounter for screening for infections with a predominantly sexual mode of transmission: Secondary | ICD-10-CM | POA: Diagnosis present

## 2020-07-28 DIAGNOSIS — Z202 Contact with and (suspected) exposure to infections with a predominantly sexual mode of transmission: Secondary | ICD-10-CM

## 2020-07-28 NOTE — Discharge Instructions (Signed)

## 2020-07-28 NOTE — ED Triage Notes (Signed)
Pt presents with complaints of exposure to trichomonas. Denies any symptoms.
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
## 2020-07-31 LAB — CYTOLOGY, (ORAL, ANAL, URETHRAL) ANCILLARY ONLY
Chlamydia: NEGATIVE
Comment: NEGATIVE
Comment: NEGATIVE
Comment: NORMAL
Neisseria Gonorrhea: NEGATIVE
Trichomonas: POSITIVE — AB

## 2020-08-01 ENCOUNTER — Telehealth (HOSPITAL_COMMUNITY): Payer: Self-pay | Admitting: Emergency Medicine

## 2020-08-01 MED ORDER — METRONIDAZOLE 500 MG PO TABS: 500.0000 mg | ORAL_TABLET | Freq: Two times a day (BID) | ORAL | 0 refills | Status: DC

## 2020-08-01 NOTE — Telephone Encounter (Signed)
Positive for Trichomonas.  Per protocol, will need treatment with Flagyl 500mg  BID x 7 days.  Sent to pharmacy on file.    Attempted to call patient x 1 to review, LVM

## 2020-08-16 ENCOUNTER — Other Ambulatory Visit: Payer: Self-pay | Admitting: Family Medicine

## 2020-08-16 DIAGNOSIS — I10 Essential (primary) hypertension: Secondary | ICD-10-CM

## 2020-09-12 ENCOUNTER — Telehealth: Payer: Self-pay | Admitting: Acute Care

## 2020-09-12 NOTE — Telephone Encounter (Signed)
[place] x 1 to discuss lung cancer screening grant with pt

## 2020-09-20 ENCOUNTER — Ambulatory Visit (INDEPENDENT_AMBULATORY_CARE_PROVIDER_SITE_OTHER): Payer: 59 | Admitting: Nurse Practitioner

## 2020-09-20 ENCOUNTER — Other Ambulatory Visit: Payer: Self-pay | Admitting: Nurse Practitioner

## 2020-09-20 ENCOUNTER — Other Ambulatory Visit: Payer: Self-pay

## 2020-09-20 ENCOUNTER — Ambulatory Visit (HOSPITAL_COMMUNITY)
Admission: RE | Admit: 2020-09-20 | Discharge: 2020-09-20 | Disposition: A | Discharge status: Home / Self Care | Payer: 59 | Source: Ambulatory Visit | Attending: Nurse Practitioner | Admitting: Nurse Practitioner

## 2020-09-20 ENCOUNTER — Encounter: Payer: Self-pay | Admitting: Nurse Practitioner

## 2020-09-20 VITALS — BP 135/89 | HR 76 | Temp 97.3°F | Ht 70.5 in | Wt 258.0 lb

## 2020-09-20 DIAGNOSIS — M25511 Pain in right shoulder: Secondary | ICD-10-CM | POA: Diagnosis not present

## 2020-09-20 DIAGNOSIS — N529 Male erectile dysfunction, unspecified: Secondary | ICD-10-CM

## 2020-09-20 DIAGNOSIS — E785 Hyperlipidemia, unspecified: Secondary | ICD-10-CM

## 2020-09-20 DIAGNOSIS — Z0000 Encounter for general adult medical examination without abnormal findings: Secondary | ICD-10-CM

## 2020-09-20 DIAGNOSIS — I10 Essential (primary) hypertension: Secondary | ICD-10-CM | POA: Diagnosis not present

## 2020-09-20 DIAGNOSIS — G8929 Other chronic pain: Secondary | ICD-10-CM | POA: Diagnosis present

## 2020-09-20 LAB — POCT URINALYSIS DIPSTICK
Bilirubin, UA: NEGATIVE
Blood, UA: NEGATIVE
Glucose, UA: NEGATIVE
Ketones, UA: NEGATIVE
Leukocytes, UA: NEGATIVE
Nitrite, UA: NEGATIVE
Protein, UA: NEGATIVE
Spec Grav, UA: 1.02 (ref 1.010–1.025)
Urobilinogen, UA: 0.2 E.U./dL
pH, UA: 7 (ref 5.0–8.0)

## 2020-09-20 MED ORDER — ROSUVASTATIN CALCIUM 20 MG PO TABS: 20.0000 mg | ORAL_TABLET | Freq: Every day | ORAL | 3 refills | Status: AC

## 2020-09-20 MED ORDER — LOSARTAN POTASSIUM 100 MG PO TABS: ORAL_TABLET | ORAL | 3 refills | Status: AC

## 2020-09-20 MED ORDER — SILDENAFIL CITRATE 100 MG PO TABS: 100.0000 mg | ORAL_TABLET | Freq: Every day | ORAL | 0 refills | Status: DC | PRN

## 2020-09-20 NOTE — Patient Instructions (Signed)
   Managing Your Hypertension Hypertension is commonly called high blood pressure. This is when the force of your blood pressing against the walls of your arteries is too strong. Arteries are blood vessels that carry blood from your heart throughout your body. Hypertension forces the heart to work harder to pump blood, and may cause the arteries to become narrow or stiff. Having untreated or uncontrolled hypertension can cause heart attack, stroke, kidney disease, and other problems. What are blood pressure readings? A blood pressure reading consists of a higher number over a lower number. Ideally, your blood pressure should be below 120/80. The first ("top") number is called the systolic pressure. It is a measure of the pressure in your arteries as your heart beats. The second ("bottom") number is called the diastolic pressure. It is a measure of the pressure in your arteries as the heart relaxes. What does my blood pressure reading mean? Blood pressure is classified into four stages. Based on your blood pressure reading, your health care provider may use the following stages to determine what type of treatment you need, if any. Systolic pressure and diastolic pressure are measured in a unit called mm Hg. Normal  Systolic pressure: below 120.  Diastolic pressure: below 80. Elevated  Systolic pressure: 120-129.  Diastolic pressure: below 80. Hypertension stage 1  Systolic pressure: 130-139.  Diastolic pressure: 80-89. Hypertension stage 2  Systolic pressure: 140 or above.  Diastolic pressure: 90 or above. What health risks are associated with hypertension? Managing your hypertension is an important responsibility. Uncontrolled hypertension can lead to:  A heart attack.  A stroke.  A weakened blood vessel (aneurysm).  Heart failure.  Kidney damage.  Eye damage.  Metabolic syndrome.  Memory and concentration problems. What changes can I make to manage my  hypertension? Hypertension can be managed by making lifestyle changes and possibly by taking medicines. Your health care provider will help you make a plan to bring your blood pressure within a normal range. Eating and drinking   Eat a diet that is high in fiber and potassium, and low in salt (sodium), added sugar, and fat. An example eating plan is called the DASH (Dietary Approaches to Stop Hypertension) diet. To eat this way: ? Eat plenty of fresh fruits and vegetables. Try to fill half of your plate at each meal with fruits and vegetables. ? Eat whole grains, such as whole wheat pasta, brown rice, or whole grain bread. Fill about one quarter of your plate with whole grains. ? Eat low-fat diary products. ? Avoid fatty cuts of meat, processed or cured meats, and poultry with skin. Fill about one quarter of your plate with lean proteins such as fish, chicken without skin, beans, eggs, and tofu. ? Avoid premade and processed foods. These tend to be higher in sodium, added sugar, and fat.  Reduce your daily sodium intake. Most people with hypertension should eat less than 1,500 mg of sodium a day.  Limit alcohol intake to no more than 1 drink a day for nonpregnant women and 2 drinks a day for men. One drink equals 12 oz of beer, 5 oz of wine, or 1 oz of hard liquor. Lifestyle  Work with your health care provider to maintain a healthy body weight, or to lose weight. Ask what an ideal weight is for you.  Get at least 30 minutes of exercise that causes your heart to beat faster (aerobic exercise) most days of the week. Activities may include walking, swimming, or biking.    Include exercise to strengthen your muscles (resistance exercise), such as weight lifting, as part of your weekly exercise routine. Try to do these types of exercises for 30 minutes at least 3 days a week.  Do not use any products that contain nicotine or tobacco, such as cigarettes and e-cigarettes. If you need help quitting,  ask your health care provider.  Control any long-term (chronic) conditions you have, such as high cholesterol or diabetes. Monitoring  Monitor your blood pressure at home as told by your health care provider. Your personal target blood pressure may vary depending on your medical conditions, your age, and other factors.  Have your blood pressure checked regularly, as often as told by your health care provider. Working with your health care provider  Review all the medicines you take with your health care provider because there may be side effects or interactions.  Talk with your health care provider about your diet, exercise habits, and other lifestyle factors that may be contributing to hypertension.  Visit your health care provider regularly. Your health care provider can help you create and adjust your plan for managing hypertension. Will I need medicine to control my blood pressure? Your health care provider may prescribe medicine if lifestyle changes are not enough to get your blood pressure under control, and if:  Your systolic blood pressure is 130 or higher.  Your diastolic blood pressure is 80 or higher. Take medicines only as told by your health care provider. Follow the directions carefully. Blood pressure medicines must be taken as prescribed. The medicine does not work as well when you skip doses. Skipping doses also puts you at risk for problems. Contact a health care provider if:  You think you are having a reaction to medicines you have taken.  You have repeated (recurrent) headaches.  You feel dizzy.  You have swelling in your ankles.  You have trouble with your vision. Get help right away if:  You develop a severe headache or confusion.  You have unusual weakness or numbness, or you feel faint.  You have severe pain in your chest or abdomen.  You vomit repeatedly.  You have trouble breathing. Summary  Hypertension is when the force of blood pumping  through your arteries is too strong. If this condition is not controlled, it may put you at risk for serious complications.  Your personal target blood pressure may vary depending on your medical conditions, your age, and other factors. For most people, a normal blood pressure is less than 120/80.  Hypertension is managed by lifestyle changes, medicines, or both. Lifestyle changes include weight loss, eating a healthy, low-sodium diet, exercising more, and limiting alcohol. This information is not intended to replace advice given to you by your health care provider. Make sure you discuss any questions you have with your health care provider. Document Revised: 03/12/2019 Document Reviewed: 10/16/2016 Elsevier Patient Education  2020 Elsevier Inc.  
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
## 2020-09-21 LAB — COMP. METABOLIC PANEL (12)
AST: 24 IU/L (ref 0–40)
Albumin/Globulin Ratio: 1.8 (ref 1.2–2.2)
Albumin: 4.3 g/dL (ref 3.8–4.8)
Alkaline Phosphatase: 78 IU/L (ref 44–121)
BUN/Creatinine Ratio: 14 (ref 10–24)
BUN: 14 mg/dL (ref 8–27)
Bilirubin Total: 0.8 mg/dL (ref 0.0–1.2)
Calcium: 9.6 mg/dL (ref 8.6–10.2)
Chloride: 100 mmol/L (ref 96–106)
Creatinine, Ser: 0.99 mg/dL (ref 0.76–1.27)
GFR calc Af Amer: 93 mL/min/{1.73_m2} (ref >59)
GFR calc non Af Amer: 80 mL/min/{1.73_m2} (ref >59)
Globulin, Total: 2.4 g/dL (ref 1.5–4.5)
Glucose: 104 mg/dL — ABNORMAL HIGH (ref 65–99)
Potassium: 4.1 mmol/L (ref 3.5–5.2)
Sodium: 139 mmol/L (ref 134–144)
Total Protein: 6.7 g/dL (ref 6.0–8.5)

## 2020-09-21 LAB — LIPID PANEL
Chol/HDL Ratio: 2.5 ratio (ref 0.0–5.0)
Cholesterol, Total: 162 mg/dL (ref 100–199)
HDL: 66 mg/dL (ref >39)
LDL Chol Calc (NIH): 79 mg/dL (ref 0–99)
Triglycerides: 94 mg/dL (ref 0–149)
VLDL Cholesterol Cal: 17 mg/dL (ref 5–40)

## 2020-09-21 LAB — PSA: Prostate Specific Ag, Serum: 1.3 ng/mL (ref 0.0–4.0)

## 2020-09-27 NOTE — Progress Notes (Signed)
Pt states he understood his labs and will keep his f/u appt.

## 2020-10-04 ENCOUNTER — Encounter: Payer: Self-pay | Admitting: Orthopedic Surgery

## 2020-10-04 ENCOUNTER — Ambulatory Visit (INDEPENDENT_AMBULATORY_CARE_PROVIDER_SITE_OTHER): Payer: 59 | Admitting: Orthopedic Surgery

## 2020-10-04 DIAGNOSIS — M19011 Primary osteoarthritis, right shoulder: Secondary | ICD-10-CM | POA: Diagnosis not present
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
## 2020-12-21 ENCOUNTER — Telehealth: Payer: Medicaid Other | Admitting: Nurse Practitioner

## 2021-12-10 DIAGNOSIS — Z130 Encounter for screening for diseases of the blood and blood-forming organs and certain disorders involving the immune mechanism: Secondary | ICD-10-CM | POA: Diagnosis not present

## 2021-12-10 DIAGNOSIS — E782 Mixed hyperlipidemia: Secondary | ICD-10-CM | POA: Diagnosis not present

## 2021-12-10 DIAGNOSIS — Z713 Dietary counseling and surveillance: Secondary | ICD-10-CM | POA: Diagnosis not present

## 2021-12-10 DIAGNOSIS — Z1329 Encounter for screening for other suspected endocrine disorder: Secondary | ICD-10-CM | POA: Diagnosis not present

## 2021-12-10 DIAGNOSIS — I10 Essential (primary) hypertension: Secondary | ICD-10-CM | POA: Diagnosis not present

## 2021-12-10 DIAGNOSIS — Z0189 Encounter for other specified special examinations: Secondary | ICD-10-CM | POA: Diagnosis not present

## 2021-12-10 DIAGNOSIS — Z131 Encounter for screening for diabetes mellitus: Secondary | ICD-10-CM | POA: Diagnosis not present

## 2021-12-21 ENCOUNTER — Other Ambulatory Visit: Payer: Self-pay

## 2021-12-21 ENCOUNTER — Encounter (HOSPITAL_COMMUNITY): Payer: Self-pay | Admitting: Emergency Medicine

## 2021-12-21 ENCOUNTER — Telehealth (HOSPITAL_COMMUNITY): Payer: Self-pay | Admitting: Internal Medicine

## 2021-12-21 ENCOUNTER — Ambulatory Visit (HOSPITAL_COMMUNITY)
Admission: EM | Admit: 2021-12-21 | Discharge: 2021-12-21 | Disposition: A | Discharge status: Home / Self Care | Payer: Medicare Other | Attending: Internal Medicine | Admitting: Internal Medicine

## 2021-12-21 DIAGNOSIS — K625 Hemorrhage of anus and rectum: Secondary | ICD-10-CM | POA: Insufficient documentation

## 2021-12-21 DIAGNOSIS — J111 Influenza due to unidentified influenza virus with other respiratory manifestations: Secondary | ICD-10-CM | POA: Diagnosis present

## 2021-12-21 HISTORY — DX: Pure hypercholesterolemia, unspecified: E78.00

## 2021-12-21 HISTORY — DX: Essential (primary) hypertension: I10

## 2021-12-21 LAB — CBC
HCT: 45.6 % (ref 39.0–52.0)
Hemoglobin: 15 g/dL (ref 13.0–17.0)
MCH: 30.1 pg (ref 26.0–34.0)
MCHC: 32.9 g/dL (ref 30.0–36.0)
MCV: 91.4 fL (ref 80.0–100.0)
Platelets: 256 10*3/uL (ref 150–400)
RBC: 4.99 MIL/uL (ref 4.22–5.81)
RDW: 13.2 % (ref 11.5–15.5)
WBC: 6.3 10*3/uL (ref 4.0–10.5)
nRBC: 0 % (ref 0.0–0.2)

## 2021-12-21 NOTE — Discharge Instructions (Addendum)
Your cough and runny nose appears to be related to your recent flulike illness.  The symptoms may linger for a few weeks.  You can try OTC cetirizine and/or Flonase to help with your symptoms.  In regards to your recent rectal bleeding, I will follow-up with you on your blood work.  Keep your appointment on 2/6 with gastroenterology.  Otherwise, should you develop worsening bleeding or shortness of breath, dizziness you will need to go to the emergency department.

## 2021-12-21 NOTE — ED Triage Notes (Signed)
Pt reports last Wed had cough that was productive, diarrhea and headache which caused him to miss some work. Reports wants to make sure good to go back to work.  Reports that he saw blood in BMs last week and saw a clinic that told him that didn't think was nothing to worry about and referred to GI but that is not until Feb 6.
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
## 2022-02-05 DIAGNOSIS — H40013 Open angle with borderline findings, low risk, bilateral: Secondary | ICD-10-CM | POA: Diagnosis not present

## 2022-03-11 DIAGNOSIS — Z7689 Persons encountering health services in other specified circumstances: Secondary | ICD-10-CM | POA: Diagnosis not present

## 2022-03-11 DIAGNOSIS — R7303 Prediabetes: Secondary | ICD-10-CM | POA: Diagnosis not present

## 2022-03-11 DIAGNOSIS — E782 Mixed hyperlipidemia: Secondary | ICD-10-CM | POA: Diagnosis not present

## 2022-03-11 DIAGNOSIS — Z7182 Exercise counseling: Secondary | ICD-10-CM | POA: Diagnosis not present

## 2022-03-11 DIAGNOSIS — I10 Essential (primary) hypertension: Secondary | ICD-10-CM | POA: Diagnosis not present

## 2022-03-11 DIAGNOSIS — Z713 Dietary counseling and surveillance: Secondary | ICD-10-CM | POA: Diagnosis not present

## 2022-04-09 DIAGNOSIS — H18413 Arcus senilis, bilateral: Secondary | ICD-10-CM | POA: Diagnosis not present

## 2022-04-09 DIAGNOSIS — H2512 Age-related nuclear cataract, left eye: Secondary | ICD-10-CM | POA: Diagnosis not present

## 2022-04-09 DIAGNOSIS — H2513 Age-related nuclear cataract, bilateral: Secondary | ICD-10-CM | POA: Diagnosis not present

## 2022-04-09 DIAGNOSIS — H25013 Cortical age-related cataract, bilateral: Secondary | ICD-10-CM | POA: Diagnosis not present

## 2022-04-09 DIAGNOSIS — H40013 Open angle with borderline findings, low risk, bilateral: Secondary | ICD-10-CM | POA: Diagnosis not present

## 2022-04-30 DIAGNOSIS — D210 Benign neoplasm of connective and other soft tissue of head, face and neck: Secondary | ICD-10-CM | POA: Diagnosis not present

## 2022-04-30 DIAGNOSIS — H04561 Stenosis of right lacrimal punctum: Secondary | ICD-10-CM | POA: Diagnosis not present

## 2022-04-30 DIAGNOSIS — D485 Neoplasm of uncertain behavior of skin: Secondary | ICD-10-CM | POA: Diagnosis not present

## 2022-05-07 DIAGNOSIS — Z7689 Persons encountering health services in other specified circumstances: Secondary | ICD-10-CM | POA: Diagnosis not present

## 2022-05-07 DIAGNOSIS — I10 Essential (primary) hypertension: Secondary | ICD-10-CM | POA: Diagnosis not present

## 2022-05-07 DIAGNOSIS — M546 Pain in thoracic spine: Secondary | ICD-10-CM | POA: Diagnosis not present

## 2022-06-20 DIAGNOSIS — L308 Other specified dermatitis: Secondary | ICD-10-CM | POA: Diagnosis not present

## 2022-07-01 DIAGNOSIS — H2512 Age-related nuclear cataract, left eye: Secondary | ICD-10-CM | POA: Diagnosis not present

## 2022-07-02 DIAGNOSIS — H2511 Age-related nuclear cataract, right eye: Secondary | ICD-10-CM | POA: Diagnosis not present

## 2022-07-15 DIAGNOSIS — H2511 Age-related nuclear cataract, right eye: Secondary | ICD-10-CM | POA: Diagnosis not present

## 2022-08-22 DIAGNOSIS — R3912 Poor urinary stream: Secondary | ICD-10-CM | POA: Diagnosis not present

## 2022-08-22 DIAGNOSIS — R350 Frequency of micturition: Secondary | ICD-10-CM | POA: Diagnosis not present

## 2022-09-10 DIAGNOSIS — K625 Hemorrhage of anus and rectum: Secondary | ICD-10-CM | POA: Diagnosis not present

## 2022-09-10 DIAGNOSIS — K6289 Other specified diseases of anus and rectum: Secondary | ICD-10-CM | POA: Diagnosis not present

## 2022-09-18 DIAGNOSIS — K5730 Diverticulosis of large intestine without perforation or abscess without bleeding: Secondary | ICD-10-CM | POA: Diagnosis not present

## 2022-09-18 DIAGNOSIS — K648 Other hemorrhoids: Secondary | ICD-10-CM | POA: Diagnosis not present

## 2022-09-18 DIAGNOSIS — Z09 Encounter for follow-up examination after completed treatment for conditions other than malignant neoplasm: Secondary | ICD-10-CM | POA: Diagnosis not present

## 2022-09-18 DIAGNOSIS — K625 Hemorrhage of anus and rectum: Secondary | ICD-10-CM | POA: Diagnosis not present

## 2022-09-18 DIAGNOSIS — Z86010 Personal history of colonic polyps: Secondary | ICD-10-CM | POA: Diagnosis not present

## 2022-10-09 DIAGNOSIS — I10 Essential (primary) hypertension: Secondary | ICD-10-CM | POA: Diagnosis not present

## 2022-10-09 DIAGNOSIS — Z713 Dietary counseling and surveillance: Secondary | ICD-10-CM | POA: Diagnosis not present

## 2022-10-09 DIAGNOSIS — Z7182 Exercise counseling: Secondary | ICD-10-CM | POA: Diagnosis not present

## 2022-10-16 DIAGNOSIS — I10 Essential (primary) hypertension: Secondary | ICD-10-CM | POA: Diagnosis not present

## 2022-10-16 DIAGNOSIS — Z713 Dietary counseling and surveillance: Secondary | ICD-10-CM | POA: Diagnosis not present

## 2022-10-16 DIAGNOSIS — Z7182 Exercise counseling: Secondary | ICD-10-CM | POA: Diagnosis not present

## 2022-10-16 DIAGNOSIS — R42 Dizziness and giddiness: Secondary | ICD-10-CM | POA: Diagnosis not present

## 2022-11-12 DIAGNOSIS — R7303 Prediabetes: Secondary | ICD-10-CM | POA: Diagnosis not present

## 2022-11-12 DIAGNOSIS — Z713 Dietary counseling and surveillance: Secondary | ICD-10-CM | POA: Diagnosis not present

## 2022-11-12 DIAGNOSIS — I10 Essential (primary) hypertension: Secondary | ICD-10-CM | POA: Diagnosis not present

## 2022-12-11 DIAGNOSIS — J189 Pneumonia, unspecified organism: Secondary | ICD-10-CM | POA: Diagnosis not present

## 2022-12-11 DIAGNOSIS — Z7182 Exercise counseling: Secondary | ICD-10-CM | POA: Diagnosis not present

## 2022-12-11 DIAGNOSIS — Z713 Dietary counseling and surveillance: Secondary | ICD-10-CM | POA: Diagnosis not present

## 2022-12-11 DIAGNOSIS — E669 Obesity, unspecified: Secondary | ICD-10-CM | POA: Diagnosis not present

## 2022-12-11 DIAGNOSIS — F1020 Alcohol dependence, uncomplicated: Secondary | ICD-10-CM | POA: Diagnosis not present

## 2022-12-11 DIAGNOSIS — R0602 Shortness of breath: Secondary | ICD-10-CM | POA: Diagnosis not present

## 2022-12-11 DIAGNOSIS — R600 Localized edema: Secondary | ICD-10-CM | POA: Diagnosis not present

## 2022-12-11 DIAGNOSIS — I10 Essential (primary) hypertension: Secondary | ICD-10-CM | POA: Diagnosis not present

## 2022-12-25 ENCOUNTER — Ambulatory Visit
Admission: RE | Admit: 2022-12-25 | Discharge: 2022-12-25 | Disposition: A | Discharge status: Home / Self Care | Payer: Medicare HMO | Source: Ambulatory Visit | Attending: Nurse Practitioner | Admitting: Nurse Practitioner

## 2022-12-25 ENCOUNTER — Other Ambulatory Visit: Payer: Self-pay | Admitting: Nurse Practitioner

## 2022-12-25 DIAGNOSIS — E8770 Fluid overload, unspecified: Secondary | ICD-10-CM

## 2022-12-25 DIAGNOSIS — R059 Cough, unspecified: Secondary | ICD-10-CM | POA: Diagnosis not present

## 2023-01-06 ENCOUNTER — Ambulatory Visit: Payer: Medicare HMO | Admitting: Neurology

## 2023-01-06 ENCOUNTER — Encounter: Payer: Self-pay | Admitting: Neurology

## 2023-01-06 VITALS — BP 137/79 | HR 101 | Ht 67.0 in | Wt 271.5 lb

## 2023-01-06 DIAGNOSIS — R404 Transient alteration of awareness: Secondary | ICD-10-CM | POA: Diagnosis not present

## 2023-01-06 NOTE — Patient Instructions (Addendum)
Alcohol cessation Return as needed
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
## 2023-01-09 DIAGNOSIS — R7303 Prediabetes: Secondary | ICD-10-CM | POA: Diagnosis not present

## 2023-01-09 DIAGNOSIS — I10 Essential (primary) hypertension: Secondary | ICD-10-CM | POA: Diagnosis not present

## 2023-01-09 DIAGNOSIS — R42 Dizziness and giddiness: Secondary | ICD-10-CM | POA: Diagnosis not present

## 2023-01-09 DIAGNOSIS — Z7182 Exercise counseling: Secondary | ICD-10-CM | POA: Diagnosis not present

## 2023-01-09 DIAGNOSIS — Z713 Dietary counseling and surveillance: Secondary | ICD-10-CM | POA: Diagnosis not present

## 2023-01-09 DIAGNOSIS — E782 Mixed hyperlipidemia: Secondary | ICD-10-CM | POA: Diagnosis not present

## 2023-01-09 DIAGNOSIS — E669 Obesity, unspecified: Secondary | ICD-10-CM | POA: Diagnosis not present

## 2023-01-09 DIAGNOSIS — R600 Localized edema: Secondary | ICD-10-CM | POA: Diagnosis not present

## 2023-01-09 DIAGNOSIS — R972 Elevated prostate specific antigen [PSA]: Secondary | ICD-10-CM | POA: Diagnosis not present

## 2023-05-23 DIAGNOSIS — E785 Hyperlipidemia, unspecified: Secondary | ICD-10-CM | POA: Diagnosis not present

## 2023-05-23 DIAGNOSIS — I10 Essential (primary) hypertension: Secondary | ICD-10-CM | POA: Diagnosis not present

## 2023-05-23 DIAGNOSIS — E559 Vitamin D deficiency, unspecified: Secondary | ICD-10-CM | POA: Diagnosis not present

## 2023-05-23 DIAGNOSIS — D649 Anemia, unspecified: Secondary | ICD-10-CM | POA: Diagnosis not present

## 2023-05-23 DIAGNOSIS — Z79899 Other long term (current) drug therapy: Secondary | ICD-10-CM | POA: Diagnosis not present

## 2023-05-23 DIAGNOSIS — E1169 Type 2 diabetes mellitus with other specified complication: Secondary | ICD-10-CM | POA: Diagnosis not present

## 2023-05-23 DIAGNOSIS — N400 Enlarged prostate without lower urinary tract symptoms: Secondary | ICD-10-CM | POA: Diagnosis not present

## 2023-05-23 DIAGNOSIS — Z0000 Encounter for general adult medical examination without abnormal findings: Secondary | ICD-10-CM | POA: Diagnosis not present

## 2023-06-23 ENCOUNTER — Other Ambulatory Visit: Payer: Self-pay | Admitting: Nurse Practitioner

## 2023-06-23 ENCOUNTER — Ambulatory Visit
Admission: RE | Admit: 2023-06-23 | Discharge: 2023-06-23 | Disposition: A | Discharge status: Home / Self Care | Payer: Medicare HMO | Source: Ambulatory Visit | Attending: Nurse Practitioner | Admitting: Nurse Practitioner

## 2023-06-23 DIAGNOSIS — K219 Gastro-esophageal reflux disease without esophagitis: Secondary | ICD-10-CM

## 2023-06-23 DIAGNOSIS — M25559 Pain in unspecified hip: Secondary | ICD-10-CM

## 2023-09-23 ENCOUNTER — Encounter: Payer: Self-pay | Admitting: Family Medicine

## 2023-09-24 ENCOUNTER — Other Ambulatory Visit: Payer: Self-pay

## 2023-09-24 ENCOUNTER — Emergency Department (HOSPITAL_COMMUNITY)
Admission: EM | Admit: 2023-09-24 | Discharge: 2023-09-24 | Disposition: A | Discharge status: Home / Self Care | Payer: Medicare HMO | Attending: Emergency Medicine | Admitting: Emergency Medicine

## 2023-09-24 ENCOUNTER — Emergency Department (HOSPITAL_COMMUNITY): Payer: Medicare HMO

## 2023-09-24 DIAGNOSIS — R5383 Other fatigue: Secondary | ICD-10-CM | POA: Diagnosis present

## 2023-09-24 DIAGNOSIS — R0602 Shortness of breath: Secondary | ICD-10-CM | POA: Insufficient documentation

## 2023-09-24 DIAGNOSIS — R251 Tremor, unspecified: Secondary | ICD-10-CM | POA: Insufficient documentation

## 2023-09-24 LAB — HEPATIC FUNCTION PANEL
ALT: 25 U/L (ref 0–44)
AST: 23 U/L (ref 15–41)
Albumin: 3.6 g/dL (ref 3.5–5.0)
Alkaline Phosphatase: 93 U/L (ref 38–126)
Bilirubin, Direct: <0.1 mg/dL (ref 0.0–0.2)
Total Bilirubin: 0.9 mg/dL (ref 0.3–1.2)
Total Protein: 7.3 g/dL (ref 6.5–8.1)

## 2023-09-24 LAB — BASIC METABOLIC PANEL
Anion gap: 11 (ref 5–15)
BUN: 10 mg/dL (ref 8–23)
CO2: 24 mmol/L (ref 22–32)
Calcium: 9.5 mg/dL (ref 8.9–10.3)
Chloride: 102 mmol/L (ref 98–111)
Creatinine, Ser: 0.94 mg/dL (ref 0.61–1.24)
GFR, Estimated: >60 mL/min (ref >60)
Glucose, Bld: 110 mg/dL — ABNORMAL HIGH (ref 70–99)
Potassium: 3.8 mmol/L (ref 3.5–5.1)
Sodium: 137 mmol/L (ref 135–145)

## 2023-09-24 LAB — CBC
HCT: 36.5 % — ABNORMAL LOW (ref 39.0–52.0)
Hemoglobin: 11.7 g/dL — ABNORMAL LOW (ref 13.0–17.0)
MCH: 28.3 pg (ref 26.0–34.0)
MCHC: 32.1 g/dL (ref 30.0–36.0)
MCV: 88.4 fL (ref 80.0–100.0)
Platelets: 371 10*3/uL (ref 150–400)
RBC: 4.13 MIL/uL — ABNORMAL LOW (ref 4.22–5.81)
RDW: 15.3 % (ref 11.5–15.5)
WBC: 9.2 10*3/uL (ref 4.0–10.5)
nRBC: 0 % (ref 0.0–0.2)

## 2023-09-24 LAB — BRAIN NATRIURETIC PEPTIDE: B Natriuretic Peptide: 27.1 pg/mL (ref 0.0–100.0)

## 2023-09-24 MED ORDER — IOHEXOL 350 MG/ML SOLN
75.0000 mL | Freq: Once | INTRAVENOUS | Status: AC | PRN
  Administered 2023-09-24: 75 mL via INTRAVENOUS

## 2023-09-24 NOTE — ED Triage Notes (Signed)
Pt. Stated, Ive had some shakes off and on , I have sometimes SOB . The Dr. Rhina Brackett me because I had an abnormal lad (D-Dimer on a piece of paper elevated). Started about 8 months ago.
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
## 2023-09-24 NOTE — ED Provider Notes (Signed)
[place] EMERGENCY DEPARTMENT AT [facility] Provider Note   CSN: [id number] Arrival date & time: 09/24/23  7829     History {Add pertinent medical, surgical, social history, OB history to HPI:1} Chief Complaint  Patient presents with   Shaking   Shortness of Breath   abnormal labs    Lelend Aguino is a 67 y.o. male.  HPI Patient presents from his primary care clinic with concern for dyspnea, fatigue, episodic shaking.  He describes the episodes as occurring for several moments, without obvious precipitant, or any clear alleviating or exacerbating factors.  Typically the episodes involve shaking for several moments, have no postictal like.  Subsequently.  He saw his physician, had labs sent 1 of these notable for positive D-dimer.  No history of seizures, no recent medication change, diet change, activity change.    Home Medications Prior to Admission medications   Medication Sig Start Date End Date Taking? Authorizing Provider  hydrocortisone (ANUSOL-HC) 2.5 % rectal cream Place 1 application rectally 2 (two) times daily. 04/28/19   Sharlene Dory, DO  losartan (COZAAR) 100 MG tablet TAKE 1 TABLET BY MOUTH ONCE DAILY 09/20/20   Barbette Merino, NP  rosuvastatin (CRESTOR) 20 MG tablet Take 1 tablet (20 mg total) by mouth daily. 09/20/20   Barbette Merino, NP  sildenafil (VIAGRA) 100 MG tablet Take 1 tablet (100 mg total) by mouth daily as needed. 09/20/20   Barbette Merino, NP      Allergies    Patient has no known allergies.    Review of Systems   Review of Systems  Physical Exam Updated Vital Signs BP 126/70 (BP Location: Right Arm)   Pulse (!) 101   Temp 98.3 F (36.8 C)   Resp (!) 21   Ht 5\' 10"  (1.778 m)   Wt 112 kg   SpO2 100%   BMI 35.44 kg/m  Physical Exam Vitals and nursing note reviewed.  Constitutional:      General: He is not in acute distress.    Appearance: He is well-developed.  HENT:     Head: Normocephalic and  atraumatic.  Eyes:     Conjunctiva/sclera: Conjunctivae normal.  Cardiovascular:     Rate and Rhythm: Normal rate and regular rhythm.  Pulmonary:     Effort: Pulmonary effort is normal. No respiratory distress.     Breath sounds: No stridor.  Abdominal:     General: There is no distension.  Skin:    General: Skin is warm and dry.  Neurological:     Mental Status: He is alert and oriented to person, place, and time.     ED Results / Procedures / Treatments   Labs (all labs ordered are listed, but only abnormal results are displayed) Labs Reviewed  BASIC METABOLIC PANEL  CBC  BRAIN NATRIURETIC PEPTIDE  HEPATIC FUNCTION PANEL    EKG None  Radiology No results found.  Procedures Procedures  {Document cardiac monitor, telemetry assessment procedure when appropriate:1}  Medications Ordered in ED Medications - No data to display  ED Course/ Medical Decision Making/ A&P   {   Click here for ABCD2, HEART and other calculatorsREFRESH Note before signing :1}                              Medical Decision Making Adult male presents with episodic periods of altered mental status with shaking, the context of ongoing generalized complaints.  Broad  differential including seizure, electrolyte abnormalities, essential tremor, seizure-like episodes, thromboembolic processes, intracranial mass considered.  Given positive D-dimer as an outpatient CT angiography was added to head CT and other initial ED labs. Cardiac 105 tach abnormal Pulse ox 99% room air normal    Amount and/or Complexity of Data Reviewed External Data Reviewed: notes.    Details: Note from physicians office with elevated D-dimer result. Labs: ordered. Decision-making details documented in ED Course. Radiology: ordered and independent interpretation performed. Decision-making details documented in ED Course. ECG/medicine tests: ordered and independent interpretation performed. Decision-making details documented in  ED Course.  Risk Prescription drug management. Decision regarding hospitalization. Diagnosis or treatment significantly limited by social determinants of health.   ***  {Document critical care time when appropriate:1} {Document review of labs and clinical decision tools ie heart score, Chads2Vasc2 etc:1}  {Document your independent review of radiology images, and any outside records:1} {Document your discussion with family members, caretakers, and with consultants:1} {Document social determinants of health affecting pt's care:1} {Document your decision making why or why not admission, treatments were needed:1} Final Clinical Impression(s) / ED Diagnoses Final diagnoses:  None    Rx / DC Orders ED Discharge Orders     None

## 2023-09-25 ENCOUNTER — Emergency Department (HOSPITAL_COMMUNITY)
Admission: EM | Admit: 2023-09-25 | Discharge: 2023-09-25 | Disposition: A | Discharge status: Home / Self Care | Payer: Medicare HMO | Source: Home / Self Care
